# Patient Record
Sex: Female | Born: 1970
Health system: Southern US, Community
[De-identification: ages and names within clinical notes are randomized; demographics above are authoritative.]

## PROBLEM LIST (undated history)

## (undated) DIAGNOSIS — F411 Generalized anxiety disorder: Secondary | ICD-10-CM

## (undated) DIAGNOSIS — D649 Anemia, unspecified: Secondary | ICD-10-CM

## (undated) HISTORY — PX: TUBAL LIGATION: SHX77

## (undated) HISTORY — DX: Generalized anxiety disorder: F41.1

## (undated) HISTORY — DX: Anemia, unspecified: D64.9

---

## 2010-07-03 LAB — HM COLONOSCOPY

## 2013-12-19 ENCOUNTER — Encounter (HOSPITAL_BASED_OUTPATIENT_CLINIC_OR_DEPARTMENT_OTHER): Payer: Self-pay | Admitting: *Deleted

## 2013-12-19 ENCOUNTER — Emergency Department (HOSPITAL_BASED_OUTPATIENT_CLINIC_OR_DEPARTMENT_OTHER)
Admission: EM | Admit: 2013-12-19 | Discharge: 2013-12-19 | Disposition: A | Discharge status: Home / Self Care | Payer: Federal, State, Local not specified - PPO | Attending: Emergency Medicine | Admitting: Emergency Medicine

## 2013-12-19 DIAGNOSIS — S81812A Laceration without foreign body, left lower leg, initial encounter: Secondary | ICD-10-CM

## 2013-12-19 DIAGNOSIS — Z23 Encounter for immunization: Secondary | ICD-10-CM | POA: Insufficient documentation

## 2013-12-19 DIAGNOSIS — Y9389 Activity, other specified: Secondary | ICD-10-CM | POA: Insufficient documentation

## 2013-12-19 DIAGNOSIS — Y929 Unspecified place or not applicable: Secondary | ICD-10-CM | POA: Diagnosis not present

## 2013-12-19 DIAGNOSIS — S81811A Laceration without foreign body, right lower leg, initial encounter: Secondary | ICD-10-CM | POA: Insufficient documentation

## 2013-12-19 DIAGNOSIS — W228XXA Striking against or struck by other objects, initial encounter: Secondary | ICD-10-CM | POA: Insufficient documentation

## 2013-12-19 DIAGNOSIS — Y998 Other external cause status: Secondary | ICD-10-CM | POA: Insufficient documentation

## 2013-12-19 MED ORDER — TETANUS-DIPHTH-ACELL PERTUSSIS 5-2.5-18.5 LF-MCG/0.5 IM SUSP
0.5000 mL | Freq: Once | INTRAMUSCULAR | Status: AC
  Administered 2013-12-19: 0.5 mL via INTRAMUSCULAR
  Filled 2013-12-19: qty 0.5

## 2013-12-19 MED ORDER — HYDROCODONE-ACETAMINOPHEN 5-325 MG PO TABS: ORAL_TABLET | ORAL | Status: DC

## 2013-12-19 MED ORDER — LIDOCAINE-EPINEPHRINE 2 %-1:100000 IJ SOLN
20.0000 mL | Freq: Once | INTRAMUSCULAR | Status: AC
  Administered 2013-12-19: 20 mL

## 2013-12-19 MED ORDER — LIDOCAINE-EPINEPHRINE (PF) 2 %-1:200000 IJ SOLN
INTRAMUSCULAR | Status: AC
  Filled 2013-12-19: qty 20

## 2013-12-19 NOTE — ED Notes (Signed)
She was at the gym doing box jumps and both lower legs hit the edge of the wooden boxes. Lacerations to both lower legs. Bleeding controlled with pressure dressings.

## 2013-12-19 NOTE — ED Provider Notes (Signed)
CSN: [id number]     Arrival date & time 12/19/13  1938 History   First MD Initiated Contact with Patient 12/19/13 2044     Chief Complaint  Patient presents with  . Laceration     (Consider location/radiation/quality/duration/timing/severity/associated sxs/prior Treatment) HPI Comments: Patient presents with complaint of bilateral lower extremity lacerations after doing box jumps at the gymnasium. Patient slipped and struck lower legs on the edge of the wooden box. Patient was attended to on scene by an EMT bystander. Pressure was applied to the wounds and they were bandaged. Patient was transported to the emergency department by her husband. No other treatments prior to arrival. No difficulty walking.   The history is provided by the patient.    History reviewed. No pertinent past medical history. History reviewed. No pertinent past surgical history. No family history on file. History  Substance Use Topics  . Smoking status: Never Smoker   . Smokeless tobacco: Not on file  . Alcohol Use: No   OB History    No data available     Review of Systems  Constitutional: Negative for fever.  HENT: Negative for sore throat.   Eyes: Negative for discharge.  Gastrointestinal: Negative for rectal pain.  Genitourinary: Negative for dysuria, frequency, vaginal bleeding, vaginal discharge, genital sores and pelvic pain.  Musculoskeletal: Negative for arthralgias.  Skin: Positive for wound. Negative for rash.  Hematological: Negative for adenopathy.      Allergies  Review of patient's allergies indicates no known allergies.  Home Medications   Prior to Admission medications   Not on File   BP 93/61 mmHg  Pulse 66  Temp(Src) 98.3 F (36.8 C) (Oral)  Resp 16  Ht 5\' 1"  (1.549 m)  Wt 160 lb (72.576 kg)  BMI 30.25 kg/m2  SpO2 100% Physical Exam  Constitutional: She appears well-developed and well-nourished.  HENT:  Head: Normocephalic and atraumatic.  Eyes: Pupils are equal,  round, and reactive to light.  Neck: Normal range of motion. Neck supple.  Cardiovascular: Exam reveals no decreased pulses.   Pulses:      Dorsalis pedis pulses are 2+ on the right side, and 2+ on the left side.       Posterior tibial pulses are 2+ on the right side, and 2+ on the left side.  Musculoskeletal: She exhibits edema and tenderness.       Right ankle: Normal. She exhibits normal range of motion.       Left ankle: Normal. She exhibits normal range of motion.       Right lower leg: She exhibits laceration.       Left lower leg: She exhibits laceration.       Legs:      Right foot: Normal. There is normal range of motion.       Left foot: Normal. There is normal range of motion.  Neurological: She is alert. No sensory deficit.  Motor, sensation, and vascular distal to the injury is fully intact.   Skin: Skin is warm and dry.  Psychiatric: She has a normal mood and affect.  Nursing note and vitals reviewed.   ED Course  Procedures (including critical care time) Labs Review Labs Reviewed - No data to display  Imaging Review No results found.   EKG Interpretation None       Pt seen and examined. Discussed wound repair procedure. Patient agrees to proceed. No allergies.      LACERATION REPAIR Performed by: Carolee RotaGEIPLE,Vernal Hritz S Authorized by: Carolee RotaGEIPLE,Latria Mccarron S  Consent: Verbal consent obtained. Risks and benefits: risks, benefits and alternatives were discussed Consent given by: patient Patient identity confirmed: provided demographic data Prepped and Draped in normal sterile fashion Wound explored  Laceration Location: R anterior lower leg  Laceration Length: 6cm  No Foreign Bodies seen or palpated  Anesthesia: local infiltration  Local anesthetic: lidocaine 2% with epinephrine  Anesthetic total: 8 ml  Irrigation method: skin scrub with dermal cleanser Amount of cleaning: standard  Skin closure: 4-0 Ethilon  Number of sutures: 12  Technique: simple  interrupted  Patient tolerance: Patient tolerated the procedure well with no immediate complications.   LACERATION REPAIR Performed by: Carolee RotaGEIPLE,Toure Edmonds S Authorized by: Carolee RotaGEIPLE,Lorely Bubb S Consent: Verbal consent obtained. Risks and benefits: risks, benefits and alternatives were discussed Consent given by: patient Patient identity confirmed: provided demographic data Prepped and Draped in normal sterile fashion Wound explored  Laceration Location: L anterior lower leg  Laceration Length: 5cm  No Foreign Bodies seen or palpated  Anesthesia: local infiltration  Local anesthetic: lidocaine 2% with epinephrine  Anesthetic total: 8 ml  Irrigation method: skin scrub with dermal cleanser Amount of cleaning: standard  Skin closure: 4-0 Ethilon  Number of sutures: 9  Technique: simple interrupted  Patient tolerance: Patient tolerated the procedure well with no immediate complications.  Patient counseled on wound care. Patient counseled on need to return or see PCP/urgent care for suture removal in 10-14 days. Patient was urged to return to the Emergency Department urgently with worsening pain, swelling, expanding erythema especially if it streaks away from the affected area, fever, or if they have any other concerns. Patient verbalized understanding.   Patient counseled on use of narcotic pain medications. Counseled not to combine these medications with others containing tylenol. Urged not to drink alcohol, drive, or perform any other activities that requires focus while taking these medications. The patient verbalizes understanding and agrees with the plan.   MDM   Final diagnoses:  Laceration of right lower extremity, initial encounter  Laceration of left lower extremity, initial encounter   Patient with bilateral lower extremity lacerations. Wounds were clean without evidence of foreign body. There are no motor or neuro deficits distal to the injury. Wounds were cleaned and  repaired without complication.    Renne CriglerJoshua Laquan Ludden, PA-C 12/19/13 2324  Layla MawKristen N Ward, DO 12/19/13 2353

## 2013-12-19 NOTE — Discharge Instructions (Signed)
Please read and follow all provided instructions.  Your diagnoses today include:  1. Laceration of right lower extremity, initial encounter   2. Laceration of left lower extremity, initial encounter     Tests performed today include:  Vital signs. See below for your results today.   Medications prescribed:   Vicodin (hydrocodone/acetaminophen) - narcotic pain medication  DO NOT drive or perform any activities that require you to be awake and alert because this medicine can make you drowsy. BE VERY CAREFUL not to take multiple medicines containing Tylenol (also called acetaminophen). Doing so can lead to an overdose which can damage your liver and cause liver failure and possibly death.  Take any prescribed medications only as directed.   Home care instructions:  Follow any educational materials and wound care instructions contained in this packet.   Keep affected area above the level of your heart when possible to minimize swelling. Wash area gently twice a day with warm soapy water. Do not apply alcohol or hydrogen peroxide. Cover the area if it draining or weeping.   Follow-up instructions: Suture Removal: Return to the Emergency Department or see your primary care care doctor in 10-14 days for a recheck of your wound and removal of your sutures or staples.    Return instructions:  Return to the Emergency Department if you have:  Fever  Worsening pain  Worsening swelling of the wound  Pus draining from the wound  Redness of the skin that moves away from the wound, especially if it streaks away from the affected area   Any other emergent concerns  Your vital signs today were: BP 93/61 mmHg   Pulse 66   Temp(Src) 98.3 F (36.8 C) (Oral)   Resp 16   Ht 5\' 1"  (1.549 m)   Wt 160 lb (72.576 kg)   BMI 30.25 kg/m2   SpO2 100% If your blood pressure (BP) was elevated above 135/85 this visit, please have this repeated by your doctor within one month. --------------

## 2013-12-22 ENCOUNTER — Ambulatory Visit: Payer: Federal, State, Local not specified - PPO | Admitting: Medical

## 2013-12-30 ENCOUNTER — Ambulatory Visit (INDEPENDENT_AMBULATORY_CARE_PROVIDER_SITE_OTHER): Payer: Federal, State, Local not specified - PPO | Admitting: Medical

## 2013-12-30 ENCOUNTER — Encounter: Payer: Self-pay | Admitting: Medical

## 2013-12-30 VITALS — BP 116/76 | HR 70 | Temp 98.7°F | Ht 61.75 in | Wt 160.9 lb

## 2013-12-30 DIAGNOSIS — L0390 Cellulitis, unspecified: Secondary | ICD-10-CM | POA: Insufficient documentation

## 2013-12-30 DIAGNOSIS — Z4802 Encounter for removal of sutures: Secondary | ICD-10-CM | POA: Insufficient documentation

## 2013-12-30 DIAGNOSIS — L03119 Cellulitis of unspecified part of limb: Secondary | ICD-10-CM

## 2013-12-30 MED ORDER — CEPHALEXIN 500 MG PO CAPS: 500.0000 mg | ORAL_CAPSULE | Freq: Three times a day (TID) | ORAL | Status: DC

## 2013-12-30 NOTE — Patient Instructions (Signed)
Your lacerations don't look completley healed and secondary infection may be be impeding healing process. So I am prescribing cephalexin antibiotic but do want you to come in on Monday or Tuesday for removal of remaining sutures. At that point will be 14-15 days.

## 2013-12-30 NOTE — Progress Notes (Signed)
Pre visit review using our clinic review tool, if applicable. No additional management support is needed unless otherwise documented below in the visit note. 

## 2013-12-30 NOTE — Progress Notes (Signed)
Subjective:    Patient ID: Kristin Macdonald, female    DOB: 1971/02/05, 43 y.o.   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  Pt has hx of anemia during pregnancy. But none since. Last labs one yr ago and hb/hct normal.  4 aunts on dad side had breast cancer. Pt states every mammogram. She states some calcium build up that caused her to have work up but then determined negative.   [facility] Pt does see gynecologist for her paps.  In December she is due for routine screening. She does have apponitment.  Pt was doing some box jumps. She had sutures placed 11 days. Ago.Pt states very deep. She had sutures place in our ED. No antibiotics given after surgery procedure.  Past Medical History  Diagnosis Date  . Anemia     History   Social History  . Marital Status: Married    Spouse Name: N/A    Number of Children: N/A  . Years of Education: N/A   Occupational History  . Not on file.   Social History Main Topics  . Smoking status: Never Smoker   . Smokeless tobacco: Not on file  . Alcohol Use: No  . Drug Use: No  . Sexual Activity: Yes   Other Topics Concern  . Not on file   Social History Narrative    Past Surgical History  Procedure Laterality Date  . Tubal ligation    . Tubal ligation      Family History  Problem Relation Age of Onset  . Hypertension Mother   . Cancer Father     No Known Allergies  Current Outpatient Prescriptions on File Prior to Visit  Medication Sig Dispense Refill  . HYDROcodone-acetaminophen (NORCO/VICODIN) 5-325 MG per tablet Take 1-2 tablets every 6 hours as needed for severe pain 10 tablet 0   No current facility-administered medications on file prior to visit.    BP 116/76 mmHg  Pulse 70  Temp(Src) 98.7 F (37.1 C) (Oral)  Ht 5' 1.75"  (1.568 m)  Wt 160 lb 14.4 oz (72.984 kg)  BMI 29.68 kg/m2  SpO2 100%  LMP 12/09/2013           Review of Systems  Constitutional: Negative for fever, chills and fatigue.  HENT: Negative for congestion, ear discharge, ear pain, nosebleeds, postnasal drip, rhinorrhea, sinus pressure, sore throat and trouble swallowing.   Respiratory: Negative for cough, chest tightness, shortness of breath and wheezing.   Cardiovascular: Negative for chest pain and palpitations.  Gastrointestinal: Negative for nausea, vomiting, abdominal pain, diarrhea and constipation.  Genitourinary: Negative for dysuria and flank pain.  Musculoskeletal: Negative for back pain.  Skin:       Healing lacerations to both pretibial areas. Redness to suture area.  Neurological: Negative for dizziness, tremors, seizures, syncope, weakness, light-headedness, numbness and headaches.  Hematological: Negative for adenopathy. Does not bruise/bleed easily.  Psychiatric/Behavioral: Negative for suicidal ideas, behavioral problems and dysphoric mood. The patient is not nervous/anxious.        Objective:   Physical Exam   Lower- ext- v shaped lacerations. Rt side 6cm length. Lt side 5 cm. Both have redness and tenderness around sutures. Some areas don't look completely healed. Other areas sutures area partially buried.  Assessment & Plan:

## 2013-12-30 NOTE — Assessment & Plan Note (Signed)
Secondary infection of wound. Post trauma. Rx cephalexin and follow up on Monday for suture removal.

## 2013-12-30 NOTE — Assessment & Plan Note (Signed)
Slow healing wound and infection may be slowing down healing process. I removed all sutures on rt and left leg that were buried and looked like would be difficult to remove on follow up in 3 days. But left 8 in rt pretibial area and 4 in left pretibial area since wound did not look healed and wanted to avoid dehiscence. Follow up on Monday for wound eval and likely removal of all sutures.

## 2014-01-02 ENCOUNTER — Ambulatory Visit (INDEPENDENT_AMBULATORY_CARE_PROVIDER_SITE_OTHER): Payer: Federal, State, Local not specified - PPO | Admitting: Medical

## 2014-01-02 ENCOUNTER — Encounter: Payer: Self-pay | Admitting: Medical

## 2014-01-02 ENCOUNTER — Telehealth: Payer: Self-pay | Admitting: Medical

## 2014-01-02 VITALS — BP 106/72 | HR 77 | Temp 99.0°F | Ht 61.75 in | Wt 160.8 lb

## 2014-01-02 DIAGNOSIS — L03119 Cellulitis of unspecified part of limb: Secondary | ICD-10-CM

## 2014-01-02 DIAGNOSIS — S81809D Unspecified open wound, unspecified lower leg, subsequent encounter: Secondary | ICD-10-CM

## 2014-01-02 NOTE — Telephone Encounter (Signed)
Caller name: Zaraya Relation to pt: self Call back number: [phone] Pharmacy: [facility] at [address]  Reason for call:   Patient states that Ramon Dredgedward was going to send in another antibiotic because he said that the infection has not cleared. I don't see that anything has been sent.

## 2014-01-02 NOTE — Progress Notes (Signed)
Pre visit review using our clinic review tool, if applicable. No additional management support is needed unless otherwise documented below in the visit note. 

## 2014-01-02 NOTE — Patient Instructions (Signed)
Your laceration does look improved but not as I would expect. I removed all sutures today. I am switching you to bactrim ds. Stop cephalexin. We are sending out wound culture.  Follow up on Monday(7 days)with me or as needed.  If you legs do not look improved then will refer you to wound care for slow healing wound.

## 2014-01-02 NOTE — Telephone Encounter (Signed)
Patient is requesting a call back when this has been sent

## 2014-01-02 NOTE — Progress Notes (Signed)
   Subjective:    Patient ID: Kristin Macdonald, female    DOB: Mar 25, 1970, 43 y.o.   MRN: [id number]  HPI  Pt in for follow for  her pretibial laceration wounds. I removed all of her sutures today since the wounds looked like may have been infected and likely were delayed  healing on last visit. No fevers, no chills. No obvious discharge. 14 days today since sutures placed so I removed all remaining sutures.  Pt has been on cephalexin. Pt reports the red areas are better in term of soreness but she still has some tenderness.  Past Medical History  Diagnosis Date  . Anemia     History   Social History  . Marital Status: Married    Spouse Name: N/A    Number of Children: N/A  . Years of Education: N/A   Occupational History  . Not on file.   Social History Main Topics  . Smoking status: Never Smoker   . Smokeless tobacco: Not on file  . Alcohol Use: No  . Drug Use: No  . Sexual Activity: Yes   Other Topics Concern  . Not on file   Social History Narrative    Past Surgical History  Procedure Laterality Date  . Tubal ligation    . Tubal ligation      Family History  Problem Relation Age of Onset  . Hypertension Mother   . Cancer Father     No Known Allergies  Current Outpatient Prescriptions on File Prior to Visit  Medication Sig Dispense Refill  . cephALEXin (KEFLEX) 500 MG capsule Take 1 capsule (500 mg total) by mouth 3 (three) times daily. 21 capsule 0  . HYDROcodone-acetaminophen (NORCO/VICODIN) 5-325 MG per tablet Take 1-2 tablets every 6 hours as needed for severe pain 10 tablet 0   No current facility-administered medications on file prior to visit.    BP 106/72 mmHg  Pulse 77  Temp(Src) 99 F (37.2 C) (Oral)  Ht 5' 1.75" (1.568 m)  Wt 160 lb 12.8 oz (72.938 kg)  BMI 29.67 kg/m2  SpO2 98%  LMP 12/09/2013       Review of Systems  Constitutional: Negative for fever, chills and fatigue.  Respiratory: Negative for cough, chest tightness,  shortness of breath and wheezing.   Cardiovascular: Negative for chest pain and palpitations.  Gastrointestinal: Negative for nausea, vomiting, abdominal pain, diarrhea and constipation.  Genitourinary: Negative for dysuria and flank pain.  Musculoskeletal: Negative for back pain.  Skin:       Around the laceration still pinkish red appearance.  Neurological: Negative for dizziness and numbness.  Hematological: Negative for adenopathy. Does not bruise/bleed easily.       Objective:   Physical Exam   Lower extremities Still red appearance. Rt side of tibia laceration  area is improved but not completely. Mid center of laceration dry yellow crusted scab.  Lt side tibia laceration area still shows some region where edges of laceration are not completely healed.  Did remove all sutures since at this point needs to be removed since only serving as source of irritation and body beginning to grow over sutures in most areas of laceration.         Assessment & Plan:

## 2014-01-03 ENCOUNTER — Other Ambulatory Visit: Payer: Self-pay

## 2014-01-03 MED ORDER — SULFAMETHOXAZOLE-TRIMETHOPRIM 800-160 MG PO TABS: 1.0000 | ORAL_TABLET | Freq: Two times a day (BID) | ORAL | Status: DC

## 2014-01-03 NOTE — Assessment & Plan Note (Signed)
Today, I got a wound culture since some areas of the wound are not healing as fast as expected. Stop cephalexin. Rx bactrim. Follow up in one week or as needed. If wounds not healing completely by follow up then send to wound care center.

## 2014-01-03 NOTE — Telephone Encounter (Signed)
Patient seen in office yesterday.

## 2014-01-05 LAB — WOUND CULTURE
GRAM STAIN: NONE SEEN
Gram Stain: NONE SEEN

## 2014-01-09 ENCOUNTER — Encounter: Payer: Self-pay | Admitting: Medical

## 2014-01-09 ENCOUNTER — Ambulatory Visit (INDEPENDENT_AMBULATORY_CARE_PROVIDER_SITE_OTHER): Payer: Federal, State, Local not specified - PPO | Admitting: Medical

## 2014-01-09 VITALS — BP 117/84 | HR 87 | Temp 98.3°F | Ht 61.75 in | Wt 155.8 lb

## 2014-01-09 DIAGNOSIS — L03119 Cellulitis of unspecified part of limb: Secondary | ICD-10-CM

## 2014-01-09 DIAGNOSIS — F411 Generalized anxiety disorder: Secondary | ICD-10-CM

## 2014-01-09 HISTORY — DX: Generalized anxiety disorder: F41.1

## 2014-01-09 MED ORDER — CLONAZEPAM 0.5 MG PO TABS: 0.5000 mg | ORAL_TABLET | Freq: Two times a day (BID) | ORAL | Status: DC | PRN

## 2014-01-09 NOTE — Assessment & Plan Note (Signed)
Your pretibial regions are finally healing. They don't look to have any infection today. There was some bacteria on culture but not many. Bactrim was on the sensitivity report and clinically it  appears healing now. So  I don't think you need further antibiotics except just to finish full course of what your are already on.

## 2014-01-09 NOTE — Patient Instructions (Addendum)
Your pretibial regions are finally healing. They don't look to have any infection today. There was some bacteria on culture but not many. Bactrim was on the sensitivity report and clinically it  appears healing now. So  I don't think you need further antibiotics except just to finish full course of what your are already on.  For your recently anxiety related to your marital situation, I am making clonazepam to use if needed.  Follow up 2-4 weeks if needed for either of above conditions

## 2014-01-09 NOTE — Progress Notes (Signed)
   Subjective:    Patient ID: Kristin Macdonald, female    DOB: [birth year], 43 y.o.   MRN: [id number]  HPI   Pt in for check of her pretibial laceration wounds. Pt states that the area are no longer tender. Full scabs are forming. No discharge. No fever or chills.   Pt states that she is very stressed and anxious. Pt husband just told her day after thanksgiving that he wanted a divorce. She is scheduled to see a therapist. No history of anxiety and depression.   Past Medical History  Diagnosis Date  . Anemia     History   Social History  . Marital Status: Married    Spouse Name: N/A    Number of Children: N/A  . Years of Education: N/A   Occupational History  . Not on file.   Social History Main Topics  . Smoking status: Never Smoker   . Smokeless tobacco: Not on file  . Alcohol Use: No  . Drug Use: No  . Sexual Activity: Yes   Other Topics Concern  . Not on file   Social History Narrative    Past Surgical History  Procedure Laterality Date  . Tubal ligation    . Tubal ligation      Family History  Problem Relation Age of Onset  . Hypertension Mother   . Cancer Father     No Known Allergies  Current Outpatient Prescriptions on File Prior to Visit  Medication Sig Dispense Refill  . cephALEXin (KEFLEX) 500 MG capsule Take 1 capsule (500 mg total) by mouth 3 (three) times daily. 21 capsule 0  . HYDROcodone-acetaminophen (NORCO/VICODIN) 5-325 MG per tablet Take 1-2 tablets every 6 hours as needed for severe pain 10 tablet 0   No current facility-administered medications on file prior to visit.    BP 117/84 mmHg  Pulse 87  Temp(Src) 98.3 F (36.8 C) (Oral)  Ht 5' 1.75" (1.568 m)  Wt 155 lb 12.8 oz (70.67 kg)  BMI 28.74 kg/m2  SpO2 98%  LMP 12/09/2013      Review of Systems  Constitutional: Negative for fever, chills and fatigue.  Respiratory: Negative for cough, chest tightness, shortness of breath and wheezing.   Cardiovascular: Negative for  chest pain and palpitations.  Gastrointestinal: Negative for nausea, vomiting, abdominal pain, diarrhea and constipation.  Genitourinary: Negative for dysuria and flank pain.  Musculoskeletal: Negative for back pain.  Skin:       Around the laceration no longer pink or red. Scabs formed and no drainage.  Neurological: Negative for dizziness and numbness.  Hematological: Negative for adenopathy. Does not bruise/bleed easily.       Came on recently with husband request for divorce  Psychiatric/Behavioral: Negative for suicidal ideas, behavioral problems, confusion, sleep disturbance, self-injury, dysphoric mood, decreased concentration and agitation. The patient is nervous/anxious.        Objective:   Physical Exam   General- no acute distress. But mild sad and anxious appearance. Lungs- CTA Heart-RRR Skin- pretibial Lacerations have dry appearance now. The prior gap on left side has closed up. No redness now. No warmth and areas are not tender.  Neuro- CN III-XII grossly intact.       Assessment & Plan:

## 2014-01-09 NOTE — Assessment & Plan Note (Signed)
For your recently anxiety related to your marital situation, I am making clonazepam to use if needed. If this becomes a chronic issue then would recommend rx of sertraline and try minimize the use of the clonazapam.

## 2014-01-09 NOTE — Progress Notes (Signed)
Pre visit review using our clinic review tool, if applicable. No additional management support is needed unless otherwise documented below in the visit note. 

## 2015-05-21 ENCOUNTER — Encounter: Payer: Self-pay | Admitting: Medical

## 2015-05-21 ENCOUNTER — Ambulatory Visit (INDEPENDENT_AMBULATORY_CARE_PROVIDER_SITE_OTHER): Payer: Federal, State, Local not specified - PPO | Admitting: Medical

## 2015-05-21 ENCOUNTER — Telehealth: Payer: Self-pay | Admitting: Medical

## 2015-05-21 VITALS — BP 114/76 | HR 71 | Temp 98.1°F | Ht 62.0 in | Wt 153.4 lb

## 2015-05-21 DIAGNOSIS — Z0000 Encounter for general adult medical examination without abnormal findings: Secondary | ICD-10-CM | POA: Diagnosis not present

## 2015-05-21 DIAGNOSIS — Z803 Family history of malignant neoplasm of breast: Secondary | ICD-10-CM

## 2015-05-21 DIAGNOSIS — R7989 Other specified abnormal findings of blood chemistry: Secondary | ICD-10-CM

## 2015-05-21 DIAGNOSIS — Z1239 Encounter for other screening for malignant neoplasm of breast: Secondary | ICD-10-CM

## 2015-05-21 DIAGNOSIS — Z124 Encounter for screening for malignant neoplasm of cervix: Secondary | ICD-10-CM | POA: Diagnosis not present

## 2015-05-21 DIAGNOSIS — R922 Inconclusive mammogram: Secondary | ICD-10-CM

## 2015-05-21 HISTORY — DX: Encounter for general adult medical examination without abnormal findings: Z00.00

## 2015-05-21 MED ORDER — AMMONIUM LACTATE 12 % EX CREA: TOPICAL_CREAM | CUTANEOUS | Status: DC | PRN

## 2015-05-21 MED FILL — AMMONIUM LACTATE 12% CREAM: 12 | 30 days supply | Qty: 385 | Fill #0

## 2015-05-21 NOTE — Telephone Encounter (Signed)
Pt wants female gyn. Notified referal staff.

## 2015-05-21 NOTE — Addendum Note (Signed)
Addended by: Gwenevere AbbotSAGUIER, Filicia Scogin M on: 05/21/2015 09:48 PM   Modules accepted: Kipp BroodSmartSet

## 2015-05-21 NOTE — Patient Instructions (Addendum)
Wellness examination Future fasting labs to be done. Including urine dip.   Continue diet and exercise.  Get vitamin d when in for labs.  Get [place] papsmear report when you get that done.  Will schedule 3-d mammogram for this coming May. Hx of dense tissue. Hx of aunts on fathers side with breast cancer.  For your dry skin rx lac-hydrin.  Watch area on your back. May be early vitiligo. If area worsens can refer to dermatologist.  Follow up to be determined after lab review.  Preventive Care for Adults, Female A healthy lifestyle and preventive care can promote health and wellness. Preventive health guidelines for women include the following key practices.  A routine yearly physical is a good way to check with your health care provider about your health and preventive screening. It is a chance to share any concerns and updates on your health and to receive a thorough exam.  Visit your dentist for a routine exam and preventive care every 6 months. Brush your teeth twice a day and floss once a day. Good oral hygiene prevents tooth decay and gum disease.  The frequency of eye exams is based on your age, health, family medical history, use of contact lenses, and other factors. Follow your health care provider's recommendations for frequency of eye exams.  Eat a healthy diet. Foods like vegetables, fruits, whole grains, low-fat dairy products, and lean protein foods contain the nutrients you need without too many calories. Decrease your intake of foods high in solid fats, added sugars, and salt. Eat the right amount of calories for you.Get information about a proper diet from your health care provider, if necessary.  Regular physical exercise is one of the most important things you can do for your health. Most adults should get at least 150 minutes of moderate-intensity exercise (any activity that increases your heart rate and causes you to sweat) each week. In addition, most adults need  muscle-strengthening exercises on 2 or more days a week.  Maintain a healthy weight. The body mass index (BMI) is a screening tool to identify possible weight problems. It provides an estimate of body fat based on height and weight. Your health care provider can find your BMI and can help you achieve or maintain a healthy weight.For adults 20 years and older:  A BMI below 18.5 is considered underweight.  A BMI of 18.5 to 24.9 is normal.  A BMI of 25 to 29.9 is considered overweight.  A BMI of 30 and above is considered obese.  Maintain normal blood lipids and cholesterol levels by exercising and minimizing your intake of saturated fat. Eat a balanced diet with plenty of fruit and vegetables. Blood tests for lipids and cholesterol should begin at age 62 and be repeated every 5 years. If your lipid or cholesterol levels are high, you are over 50, or you are at high risk for heart disease, you may need your cholesterol levels checked more frequently.Ongoing high lipid and cholesterol levels should be treated with medicines if diet and exercise are not working.  If you smoke, find out from your health care provider how to quit. If you do not use tobacco, do not start.  Lung cancer screening is recommended for adults aged 42-80 years who are at high risk for developing lung cancer because of a history of smoking. A yearly low-dose CT scan of the lungs is recommended for people who have at least a 30-pack-year history of smoking and are a current smoker or  have quit within the past 15 years. A pack year of smoking is smoking an average of 1 pack of cigarettes a day for 1 year (for example: 1 pack a day for 30 years or 2 packs a day for 15 years). Yearly screening should continue until the smoker has stopped smoking for at least 15 years. Yearly screening should be stopped for people who develop a health problem that would prevent them from having lung cancer treatment.  If you are pregnant, do not  drink alcohol. If you are breastfeeding, be very cautious about drinking alcohol. If you are not pregnant and choose to drink alcohol, do not have more than 1 drink per day. One drink is considered to be 12 ounces (355 mL) of beer, 5 ounces (148 mL) of wine, or 1.5 ounces (44 mL) of liquor.  Avoid use of street drugs. Do not share needles with anyone. Ask for help if you need support or instructions about stopping the use of drugs.  High blood pressure causes heart disease and increases the risk of stroke. Your blood pressure should be checked at least every 1 to 2 years. Ongoing high blood pressure should be treated with medicines if weight loss and exercise do not work.  If you are 9-38 years old, ask your health care provider if you should take aspirin to prevent strokes.  Diabetes screening is done by taking a blood sample to check your blood glucose level after you have not eaten for a certain period of time (fasting). If you are not overweight and you do not have risk factors for diabetes, you should be screened once every 3 years starting at age 8. If you are overweight or obese and you are 11-74 years of age, you should be screened for diabetes every year as part of your cardiovascular risk assessment.  Breast cancer screening is essential preventive care for women. You should practice "breast self-awareness." This means understanding the normal appearance and feel of your breasts and may include breast self-examination. Any changes detected, no matter how small, should be reported to a health care provider. Women in their 75s and 30s should have a clinical breast exam (CBE) by a health care provider as part of a regular health exam every 1 to 3 years. After age 55, women should have a CBE every year. Starting at age 62, women should consider having a mammogram (breast X-ray test) every year. Women who have a family history of breast cancer should talk to their health care provider about genetic  screening. Women at a high risk of breast cancer should talk to their health care providers about having an MRI and a mammogram every year.  Breast cancer gene (BRCA)-related cancer risk assessment is recommended for women who have family members with BRCA-related cancers. BRCA-related cancers include breast, ovarian, tubal, and peritoneal cancers. Having family members with these cancers may be associated with an increased risk for harmful changes (mutations) in the breast cancer genes BRCA1 and BRCA2. Results of the assessment will determine the need for genetic counseling and BRCA1 and BRCA2 testing.  Your health care provider may recommend that you be screened regularly for cancer of the pelvic organs (ovaries, uterus, and vagina). This screening involves a pelvic examination, including checking for microscopic changes to the surface of your cervix (Pap test). You may be encouraged to have this screening done every 3 years, beginning at age 47.  For women ages 63-65, health care providers may recommend pelvic exams and Pap  testing every 3 years, or they may recommend the Pap and pelvic exam, combined with testing for human papilloma virus (HPV), every 5 years. Some types of HPV increase your risk of cervical cancer. Testing for HPV may also be done on women of any age with unclear Pap test results.  Other health care providers may not recommend any screening for nonpregnant women who are considered low risk for pelvic cancer and who do not have symptoms. Ask your health care provider if a screening pelvic exam is right for you.  If you have had past treatment for cervical cancer or a condition that could lead to cancer, you need Pap tests and screening for cancer for at least 20 years after your treatment. If Pap tests have been discontinued, your risk factors (such as having a new sexual partner) need to be reassessed to determine if screening should resume. Some women have medical problems that  increase the chance of getting cervical cancer. In these cases, your health care provider may recommend more frequent screening and Pap tests.  Colorectal cancer can be detected and often prevented. Most routine colorectal cancer screening begins at the age of 34 years and continues through age 110 years. However, your health care provider may recommend screening at an earlier age if you have risk factors for colon cancer. On a yearly basis, your health care provider may provide home test kits to check for hidden blood in the stool. Use of a small camera at the end of a tube, to directly examine the colon (sigmoidoscopy or colonoscopy), can detect the earliest forms of colorectal cancer. Talk to your health care provider about this at age 61, when routine screening begins. Direct exam of the colon should be repeated every 5-10 years through age 34 years, unless early forms of precancerous polyps or small growths are found.  People who are at an increased risk for hepatitis B should be screened for this virus. You are considered at high risk for hepatitis B if:  You were born in a country where hepatitis B occurs often. Talk with your health care provider about which countries are considered high risk.  Your parents were born in a high-risk country and you have not received a shot to protect against hepatitis B (hepatitis B vaccine).  You have HIV or AIDS.  You use needles to inject street drugs.  You live with, or have sex with, someone who has hepatitis B.  You get hemodialysis treatment.  You take certain medicines for conditions like cancer, organ transplantation, and autoimmune conditions.  Hepatitis C blood testing is recommended for all people born from [birth year] through 1965 and any individual with known risks for hepatitis C.  Practice safe sex. Use condoms and avoid high-risk sexual practices to reduce the spread of sexually transmitted infections (STIs). STIs include gonorrhea, chlamydia,  syphilis, trichomonas, herpes, HPV, and human immunodeficiency virus (HIV). Herpes, HIV, and HPV are viral illnesses that have no cure. They can result in disability, cancer, and death.  You should be screened for sexually transmitted illnesses (STIs) including gonorrhea and chlamydia if:  You are sexually active and are younger than 24 years.  You are older than 24 years and your health care provider tells you that you are at risk for this type of infection.  Your sexual activity has changed since you were last screened and you are at an increased risk for chlamydia or gonorrhea. Ask your health care provider if you are at risk.  If  you are at risk of being infected with HIV, it is recommended that you take a prescription medicine daily to prevent HIV infection. This is called preexposure prophylaxis (PrEP). You are considered at risk if:  You are sexually active and do not regularly use condoms or know the HIV status of your partner(s).  You take drugs by injection.  You are sexually active with a partner who has HIV.  Talk with your health care provider about whether you are at high risk of being infected with HIV. If you choose to begin PrEP, you should first be tested for HIV. You should then be tested every 3 months for as long as you are taking PrEP.  Osteoporosis is a disease in which the bones lose minerals and strength with aging. This can result in serious bone fractures or breaks. The risk of osteoporosis can be identified using a bone density scan. Women ages 30 years and over and women at risk for fractures or osteoporosis should discuss screening with their health care providers. Ask your health care provider whether you should take a calcium supplement or vitamin D to reduce the rate of osteoporosis.  Menopause can be associated with physical symptoms and risks. Hormone replacement therapy is available to decrease symptoms and risks. You should talk to your health care provider  about whether hormone replacement therapy is right for you.  Use sunscreen. Apply sunscreen liberally and repeatedly throughout the day. You should seek shade when your shadow is shorter than you. Protect yourself by wearing long sleeves, pants, a wide-brimmed hat, and sunglasses year round, whenever you are outdoors.  Once a month, do a whole body skin exam, using a mirror to look at the skin on your back. Tell your health care provider of new moles, moles that have irregular borders, moles that are larger than a pencil eraser, or moles that have changed in shape or color.  Stay current with required vaccines (immunizations).  Influenza vaccine. All adults should be immunized every year.  Tetanus, diphtheria, and acellular pertussis (Td, Tdap) vaccine. Pregnant women should receive 1 dose of Tdap vaccine during each pregnancy. The dose should be obtained regardless of the length of time since the last dose. Immunization is preferred during the 27th-36th week of gestation. An adult who has not previously received Tdap or who does not know her vaccine status should receive 1 dose of Tdap. This initial dose should be followed by tetanus and diphtheria toxoids (Td) booster doses every 10 years. Adults with an unknown or incomplete history of completing a 3-dose immunization series with Td-containing vaccines should begin or complete a primary immunization series including a Tdap dose. Adults should receive a Td booster every 10 years.  Varicella vaccine. An adult without evidence of immunity to varicella should receive 2 doses or a second dose if she has previously received 1 dose. Pregnant females who do not have evidence of immunity should receive the first dose after pregnancy. This first dose should be obtained before leaving the health care facility. The second dose should be obtained 4-8 weeks after the first dose.  Human papillomavirus (HPV) vaccine. Females aged 13-26 years who have not received  the vaccine previously should obtain the 3-dose series. The vaccine is not recommended for use in pregnant females. However, pregnancy testing is not needed before receiving a dose. If a female is found to be pregnant after receiving a dose, no treatment is needed. In that case, the remaining doses should be delayed until after  the pregnancy. Immunization is recommended for any person with an immunocompromised condition through the age of 66 years if she did not get any or all doses earlier. During the 3-dose series, the second dose should be obtained 4-8 weeks after the first dose. The third dose should be obtained 24 weeks after the first dose and 16 weeks after the second dose.  Zoster vaccine. One dose is recommended for adults aged 26 years or older unless certain conditions are present.  Measles, mumps, and rubella (MMR) vaccine. Adults born before [birth year] generally are considered immune to measles and mumps. Adults born in [birth year] or later should have 1 or more doses of MMR vaccine unless there is a contraindication to the vaccine or there is laboratory evidence of immunity to each of the three diseases. A routine second dose of MMR vaccine should be obtained at least 28 days after the first dose for students attending postsecondary schools, health care workers, or international travelers. People who received inactivated measles vaccine or an unknown type of measles vaccine during 1963-1967 should receive 2 doses of MMR vaccine. People who received inactivated mumps vaccine or an unknown type of mumps vaccine before 1979 and are at high risk for mumps infection should consider immunization with 2 doses of MMR vaccine. For females of childbearing age, rubella immunity should be determined. If there is no evidence of immunity, females who are not pregnant should be vaccinated. If there is no evidence of immunity, females who are pregnant should delay immunization until after pregnancy. Unvaccinated health care  workers born before [birth year] who lack laboratory evidence of measles, mumps, or rubella immunity or laboratory confirmation of disease should consider measles and mumps immunization with 2 doses of MMR vaccine or rubella immunization with 1 dose of MMR vaccine.  Pneumococcal 13-valent conjugate (PCV13) vaccine. When indicated, a person who is uncertain of his immunization history and has no record of immunization should receive the PCV13 vaccine. All adults 11 years of age and older should receive this vaccine. An adult aged 83 years or older who has certain medical conditions and has not been previously immunized should receive 1 dose of PCV13 vaccine. This PCV13 should be followed with a dose of pneumococcal polysaccharide (PPSV23) vaccine. Adults who are at high risk for pneumococcal disease should obtain the PPSV23 vaccine at least 8 weeks after the dose of PCV13 vaccine. Adults older than 46 years of age who have normal immune system function should obtain the PPSV23 vaccine dose at least 1 year after the dose of PCV13 vaccine.  Pneumococcal polysaccharide (PPSV23) vaccine. When PCV13 is also indicated, PCV13 should be obtained first. All adults aged 46 years and older should be immunized. An adult younger than age 38 years who has certain medical conditions should be immunized. Any person who resides in a nursing home or long-term care facility should be immunized. An adult smoker should be immunized. People with an immunocompromised condition and certain other conditions should receive both PCV13 and PPSV23 vaccines. People with human immunodeficiency virus (HIV) infection should be immunized as soon as possible after diagnosis. Immunization during chemotherapy or radiation therapy should be avoided. Routine use of PPSV23 vaccine is not recommended for American Indians, [place] Natives, or people younger than 65 years unless there are medical conditions that require PPSV23 vaccine. When indicated, people who  have unknown immunization and have no record of immunization should receive PPSV23 vaccine. One-time revaccination 5 years after the first dose of PPSV23 is recommended for people aged  19-64 years who have chronic kidney failure, nephrotic syndrome, asplenia, or immunocompromised conditions. People who received 1-2 doses of PPSV23 before age 5 years should receive another dose of PPSV23 vaccine at age 72 years or later if at least 5 years have passed since the previous dose. Doses of PPSV23 are not needed for people immunized with PPSV23 at or after age 46 years.  Meningococcal vaccine. Adults with asplenia or persistent complement component deficiencies should receive 2 doses of quadrivalent meningococcal conjugate (MenACWY-D) vaccine. The doses should be obtained at least 2 months apart. Microbiologists working with certain meningococcal bacteria, [place] recruits, people at risk during an outbreak, and people who travel to or live in countries with a high rate of meningitis should be immunized. A first-year college student up through age 65 years who is living in a residence hall should receive a dose if she did not receive a dose on or after her 16th birthday. Adults who have certain high-risk conditions should receive one or more doses of vaccine.  Hepatitis A vaccine. Adults who wish to be protected from this disease, have certain high-risk conditions, work with hepatitis A-infected animals, work in hepatitis A research labs, or travel to or work in countries with a high rate of hepatitis A should be immunized. Adults who were previously unvaccinated and who anticipate close contact with an international adoptee during the first 60 days after arrival in the [place] from a country with a high rate of hepatitis A should be immunized.  Hepatitis B vaccine. Adults who wish to be protected from this disease, have certain high-risk conditions, may be exposed to blood or other infectious body fluids,  are household contacts or sex partners of hepatitis B positive people, are clients or workers in certain care facilities, or travel to or work in countries with a high rate of hepatitis B should be immunized.  Haemophilus influenzae type b (Hib) vaccine. A previously unvaccinated person with asplenia or sickle cell disease or having a scheduled splenectomy should receive 1 dose of Hib vaccine. Regardless of previous immunization, a recipient of a hematopoietic stem cell transplant should receive a 3-dose series 6-12 months after her successful transplant. Hib vaccine is not recommended for adults with HIV infection. Preventive Services / Frequency Ages 33 to 11 years  Blood pressure check.** / Every 3-5 years.  Lipid and cholesterol check.** / Every 5 years beginning at age 57.  Clinical breast exam.** / Every 3 years for women in their 67s and 2s.  BRCA-related cancer risk assessment.** / For women who have family members with a BRCA-related cancer (breast, ovarian, tubal, or peritoneal cancers).  Pap test.** / Every 2 years from ages 15 through 42. Every 3 years starting at age 67 through age 45 or 28 with a history of 3 consecutive normal Pap tests.  HPV screening.** / Every 3 years from ages 68 through ages 80 to 25 with a history of 3 consecutive normal Pap tests.  Hepatitis C blood test.** / For any individual with known risks for hepatitis C.  Skin self-exam. / Monthly.  Influenza vaccine. / Every year.  Tetanus, diphtheria, and acellular pertussis (Tdap, Td) vaccine.** / Consult your health care provider. Pregnant women should receive 1 dose of Tdap vaccine during each pregnancy. 1 dose of Td every 10 years.  Varicella vaccine.** / Consult your health care provider. Pregnant females who do not have evidence of immunity should receive the first dose after pregnancy.  HPV vaccine. / 3 doses over  6 months, if 48 and younger. The vaccine is not recommended for use in pregnant  females. However, pregnancy testing is not needed before receiving a dose.  Measles, mumps, rubella (MMR) vaccine.** / You need at least 1 dose of MMR if you were born in 1957 or later. You may also need a 2nd dose. For females of childbearing age, rubella immunity should be determined. If there is no evidence of immunity, females who are not pregnant should be vaccinated. If there is no evidence of immunity, females who are pregnant should delay immunization until after pregnancy.  Pneumococcal 13-valent conjugate (PCV13) vaccine.** / Consult your health care provider.  Pneumococcal polysaccharide (PPSV23) vaccine.** / 1 to 2 doses if you smoke cigarettes or if you have certain conditions.  Meningococcal vaccine.** / 1 dose if you are age 52 to 42 years and a Orthoptist living in a residence hall, or have one of several medical conditions, you need to get vaccinated against meningococcal disease. You may also need additional booster doses.  Hepatitis A vaccine.** / Consult your health care provider.  Hepatitis B vaccine.** / Consult your health care provider.  Haemophilus influenzae type b (Hib) vaccine.** / Consult your health care provider. Ages 3 to 64 years  Blood pressure check.** / Every year.  Lipid and cholesterol check.** / Every 5 years beginning at age 49 years.  Lung cancer screening. / Every year if you are aged 55-80 years and have a 30-pack-year history of smoking and currently smoke or have quit within the past 15 years. Yearly screening is stopped once you have quit smoking for at least 15 years or develop a health problem that would prevent you from having lung cancer treatment.  Clinical breast exam.** / Every year after age 37 years.  BRCA-related cancer risk assessment.** / For women who have family members with a BRCA-related cancer (breast, ovarian, tubal, or peritoneal cancers).  Mammogram.** / Every year beginning at age 29 years and continuing  for as long as you are in good health. Consult with your health care provider.  Pap test.** / Every 3 years starting at age 68 years through age 39 or 47 years with a history of 3 consecutive normal Pap tests.  HPV screening.** / Every 3 years from ages 36 years through ages 42 to 66 years with a history of 3 consecutive normal Pap tests.  Fecal occult blood test (FOBT) of stool. / Every year beginning at age 50 years and continuing until age 38 years. You may not need to do this test if you get a colonoscopy every 10 years.  Flexible sigmoidoscopy or colonoscopy.** / Every 5 years for a flexible sigmoidoscopy or every 10 years for a colonoscopy beginning at age 72 years and continuing until age 47 years.  Hepatitis C blood test.** / For all people born from [birth year] through 1966 and any individual with known risks for hepatitis C.  Skin self-exam. / Monthly.  Influenza vaccine. / Every year.  Tetanus, diphtheria, and acellular pertussis (Tdap/Td) vaccine.** / Consult your health care provider. Pregnant women should receive 1 dose of Tdap vaccine during each pregnancy. 1 dose of Td every 10 years.  Varicella vaccine.** / Consult your health care provider. Pregnant females who do not have evidence of immunity should receive the first dose after pregnancy.  Zoster vaccine.** / 1 dose for adults aged 63 years or older.  Measles, mumps, rubella (MMR) vaccine.** / You need at least 1 dose of MMR if you  were born in [birth year] or later. You may also need a second dose. For females of childbearing age, rubella immunity should be determined. If there is no evidence of immunity, females who are not pregnant should be vaccinated. If there is no evidence of immunity, females who are pregnant should delay immunization until after pregnancy.  Pneumococcal 13-valent conjugate (PCV13) vaccine.** / Consult your health care provider.  Pneumococcal polysaccharide (PPSV23) vaccine.** / 1 to 2 doses if you smoke  cigarettes or if you have certain conditions.  Meningococcal vaccine.** / Consult your health care provider.  Hepatitis A vaccine.** / Consult your health care provider.  Hepatitis B vaccine.** / Consult your health care provider.  Haemophilus influenzae type b (Hib) vaccine.** / Consult your health care provider. Ages 7 years and over  Blood pressure check.** / Every year.  Lipid and cholesterol check.** / Every 5 years beginning at age 23 years.  Lung cancer screening. / Every year if you are aged 4-80 years and have a 30-pack-year history of smoking and currently smoke or have quit within the past 15 years. Yearly screening is stopped once you have quit smoking for at least 15 years or develop a health problem that would prevent you from having lung cancer treatment.  Clinical breast exam.** / Every year after age 31 years.  BRCA-related cancer risk assessment.** / For women who have family members with a BRCA-related cancer (breast, ovarian, tubal, or peritoneal cancers).  Mammogram.** / Every year beginning at age 29 years and continuing for as long as you are in good health. Consult with your health care provider.  Pap test.** / Every 3 years starting at age 19 years through age 45 or 62 years with 3 consecutive normal Pap tests. Testing can be stopped between 65 and 70 years with 3 consecutive normal Pap tests and no abnormal Pap or HPV tests in the past 10 years.  HPV screening.** / Every 3 years from ages 34 years through ages 52 or 65 years with a history of 3 consecutive normal Pap tests. Testing can be stopped between 65 and 70 years with 3 consecutive normal Pap tests and no abnormal Pap or HPV tests in the past 10 years.  Fecal occult blood test (FOBT) of stool. / Every year beginning at age 31 years and continuing until age 40 years. You may not need to do this test if you get a colonoscopy every 10 years.  Flexible sigmoidoscopy or colonoscopy.** / Every 5 years for a  flexible sigmoidoscopy or every 10 years for a colonoscopy beginning at age 25 years and continuing until age 71 years.  Hepatitis C blood test.** / For all people born from [birth year] through 1966 and any individual with known risks for hepatitis C.  Osteoporosis screening.** / A one-time screening for women ages 37 years and over and women at risk for fractures or osteoporosis.  Skin self-exam. / Monthly.  Influenza vaccine. / Every year.  Tetanus, diphtheria, and acellular pertussis (Tdap/Td) vaccine.** / 1 dose of Td every 10 years.  Varicella vaccine.** / Consult your health care provider.  Zoster vaccine.** / 1 dose for adults aged 58 years or older.  Pneumococcal 13-valent conjugate (PCV13) vaccine.** / Consult your health care provider.  Pneumococcal polysaccharide (PPSV23) vaccine.** / 1 dose for all adults aged 4 years and older.  Meningococcal vaccine.** / Consult your health care provider.  Hepatitis A vaccine.** / Consult your health care provider.  Hepatitis B vaccine.** / Consult your health care provider.  Haemophilus influenzae type b (Hib) vaccine.** / Consult your health care provider. ** Family history and personal history of risk and conditions may change your health care provider's recommendations.   This information is not intended to replace advice given to you by your health care provider. Make sure you discuss any questions you have with your health care provider.   Document Released: 03/25/2001 Document Revised: 02/17/2014 Document Reviewed: 06/24/2010 Elsevier Interactive Patient Education [employer].

## 2015-05-21 NOTE — Progress Notes (Signed)
Pre visit review using our clinic review tool, if applicable. No additional management support is needed unless otherwise documented below in the visit note. 

## 2015-05-21 NOTE — Progress Notes (Addendum)
Subjective:    Patient ID: Kristin Macdonald, female    DOB: March 26, 1970, 45 y.o.   MRN: [id number]  HPI  I have reviewed pt PMH, PSH, FH, Social History and Surgical History.  Pt is not presently fasting.  Pt had pap smear in past and not abnormal. Will get another pap smear this May.  Pt has mammogram in past. Aunts have history of breast cancer.  Pt is good on her flu vaccine. Pt up to date on her last tetanus.  LMP- 2 weeks. Pt cycles are  Regular every month. Pt thinks cycle are 3 days. This is usual pattern. Some night sweats recently. She thinks hot flashes.   Pt is exercising 3 days a week at the gym. She started weight watchers in January.     Review of Systems  Constitutional: Negative for fever, chills and fatigue.  Respiratory: Negative for cough, chest tightness, shortness of breath and wheezing.   Cardiovascular: Negative for chest pain and palpitations.  Gastrointestinal: Negative for abdominal pain.  Endocrine: Positive for heat intolerance. Negative for polydipsia, polyphagia and polyuria.       Hot flashes.  Skin: Negative for rash.       Dry skin elbows and back of her legs.  Neurological: Negative for dizziness, light-headedness, numbness and headaches.  Hematological: Negative for adenopathy. Does not bruise/bleed easily.  Psychiatric/Behavioral: Negative for behavioral problems and confusion.    Past Medical History  Diagnosis Date  . Anemia   . Anxiety state 01/09/2014    Social History   Social History  . Marital Status: Married    Spouse Name: N/A  . Number of Children: N/A  . Years of Education: N/A   Occupational History  . Not on file.   Social History Main Topics  . Smoking status: Never Smoker   . Smokeless tobacco: Not on file  . Alcohol Use: No  . Drug Use: No  . Sexual Activity: Yes   Other Topics Concern  . Not on file   Social History Narrative    Past Surgical History  Procedure Laterality Date  . Tubal ligation     . Tubal ligation      Family History  Problem Relation Age of Onset  . Hypertension Mother   . Cancer Father     No Known Allergies  No current outpatient prescriptions on file prior to visit.   No current facility-administered medications on file prior to visit.    BP 114/76 mmHg  Pulse 71  Temp(Src) 98.1 F (36.7 C) (Oral)  Ht 5\' 2"  (1.575 m)  Wt 153 lb 6.4 oz (69.582 kg)  BMI 28.05 kg/m2  SpO2 98%  LMP 05/07/2015      Objective:   Physical Exam  General Mental Status- Alert. General Appearance- Not in acute distress.   Skin General: Color- Normal Color. Moisture- Normal Moisture. Mild dry flakiness to elbows. And popliteal fossae. Some faint hypopigmented rt lower back/cva area.  Neck Carotid Arteries- Normal color. Moisture- Normal Moisture. No carotid bruits. No JVD.  Chest and Lung Exam Auscultation: Breath Sounds:-Normal.  Cardiovascular Auscultation:Rythm- Regular. Murmurs & Other Heart Sounds:Auscultation of the heart reveals- No Murmurs.  Abdomen Inspection:-Inspeection Normal. Palpation/Percussion:Note:No mass. Palpation and Percussion of the abdomen reveal- Non Tender, Non Distended + BS, no rebound or guarding.   Neurologic Cranial Nerve exam:- CN III-XII intact(No nystagmus), symmetric smile. Strength:- 5/5 equal and symmetric strength both upper and lower extremities.     Assessment & Plan:  Note after  lab review. May add fsh to evaluate for hot flashes. But will review cbc/wbc first.

## 2015-05-21 NOTE — Assessment & Plan Note (Signed)
Future fasting labs to be done. Including urine dip.

## 2015-05-23 ENCOUNTER — Telehealth: Payer: Self-pay | Admitting: Medical

## 2015-05-23 ENCOUNTER — Other Ambulatory Visit (INDEPENDENT_AMBULATORY_CARE_PROVIDER_SITE_OTHER): Payer: Federal, State, Local not specified - PPO

## 2015-05-23 DIAGNOSIS — R7989 Other specified abnormal findings of blood chemistry: Secondary | ICD-10-CM | POA: Diagnosis not present

## 2015-05-23 DIAGNOSIS — E559 Vitamin D deficiency, unspecified: Secondary | ICD-10-CM

## 2015-05-23 DIAGNOSIS — R232 Flushing: Secondary | ICD-10-CM

## 2015-05-23 DIAGNOSIS — Z0189 Encounter for other specified special examinations: Secondary | ICD-10-CM | POA: Diagnosis not present

## 2015-05-23 DIAGNOSIS — Z0000 Encounter for general adult medical examination without abnormal findings: Secondary | ICD-10-CM | POA: Diagnosis not present

## 2015-05-23 LAB — CBC WITH DIFFERENTIAL/PLATELET
BASOS PCT: 0.5 % (ref 0.0–3.0)
Basophils Absolute: 0 10*3/uL (ref 0.0–0.1)
EOS PCT: 3.3 % (ref 0.0–5.0)
Eosinophils Absolute: 0.1 10*3/uL (ref 0.0–0.7)
HCT: 37.3 % (ref 36.0–46.0)
Hemoglobin: 12.3 g/dL (ref 12.0–15.0)
Lymphocytes Relative: 54.9 % — ABNORMAL HIGH (ref 12.0–46.0)
Lymphs Abs: 2.3 10*3/uL (ref 0.7–4.0)
MCHC: 33 g/dL (ref 30.0–36.0)
MCV: 94.8 fl (ref 78.0–100.0)
MONO ABS: 0.5 10*3/uL (ref 0.1–1.0)
MONOS PCT: 11.7 % (ref 3.0–12.0)
NEUTROS ABS: 1.3 10*3/uL — AB (ref 1.4–7.7)
NEUTROS PCT: 29.6 % — AB (ref 43.0–77.0)
PLATELETS: 253 10*3/uL (ref 150.0–400.0)
RBC: 3.94 Mil/uL (ref 3.87–5.11)
RDW: 12.9 % (ref 11.5–15.5)
WBC: 4.2 10*3/uL (ref 4.0–10.5)

## 2015-05-23 LAB — COMPREHENSIVE METABOLIC PANEL
ALT: 13 U/L (ref 0–35)
AST: 17 U/L (ref 0–37)
Albumin: 3.8 g/dL (ref 3.5–5.2)
Alkaline Phosphatase: 40 U/L (ref 39–117)
BUN: 13 mg/dL (ref 6–23)
CHLORIDE: 105 meq/L (ref 96–112)
CO2: 29 mEq/L (ref 19–32)
Calcium: 9.1 mg/dL (ref 8.4–10.5)
Creatinine, Ser: 0.71 mg/dL (ref 0.40–1.20)
GFR: 114.2 mL/min (ref >60.00)
GLUCOSE: 77 mg/dL (ref 70–99)
Potassium: 4 mEq/L (ref 3.5–5.1)
SODIUM: 138 meq/L (ref 135–145)
TOTAL PROTEIN: 7.1 g/dL (ref 6.0–8.3)
Total Bilirubin: 0.6 mg/dL (ref 0.2–1.2)

## 2015-05-23 LAB — LIPID PANEL
CHOL/HDL RATIO: 2
Cholesterol: 143 mg/dL (ref 0–200)
HDL: 60.2 mg/dL (ref >39.00)
LDL Cholesterol: 73 mg/dL (ref 0–99)
NonHDL: 82.66
Triglycerides: 46 mg/dL (ref 0.0–149.0)
VLDL: 9.2 mg/dL (ref 0.0–40.0)

## 2015-05-23 LAB — TSH: TSH: 0.58 u[IU]/mL (ref 0.35–4.50)

## 2015-05-23 LAB — VITAMIN D 25 HYDROXY (VIT D DEFICIENCY, FRACTURES): VITD: 13.81 ng/mL — ABNORMAL LOW (ref 30.00–100.00)

## 2015-05-23 MED ORDER — VITAMIN D (ERGOCALCIFEROL) 1.25 MG (50000 UNIT) PO CAPS: 50000.0000 [IU] | ORAL_CAPSULE | ORAL | Status: DC

## 2015-05-23 NOTE — Telephone Encounter (Signed)
Vitamin d was sent in to her pharmacy. Repeat vitamin d in 12 weeks. Will you put future order in.

## 2015-05-23 NOTE — Addendum Note (Signed)
Addended by: Neldon LabellaMABE, HOLDEN S on: 05/23/2015 10:34 AM   Modules accepted: Orders

## 2015-05-23 NOTE — Addendum Note (Signed)
Addended by: Neldon LabellaMABE, Cosmo Tetreault S on: 05/23/2015 10:39 AM   Modules accepted: Orders

## 2015-05-24 MED FILL — VIT D2 1.25 MG (50,000 UNIT: 1.25 MG | 84 days supply | Qty: 12 | Fill #0

## 2015-05-24 NOTE — Telephone Encounter (Signed)
Notified pt and scheduled lab appt for 08/16/15 at 7am, future order entered. Pt states she talked with PCP about getting lab testing for the hot flashes she has been having. Wants to know if she can do that when she returns for the vitamin d recheck?  If so, what labs do you want ordered?

## 2015-05-24 NOTE — Telephone Encounter (Signed)
Spoke with pt and advised her that the blood work could be run the same day when she comes in to recheck her vitamin D level. Pt voices understanding.

## 2015-05-24 NOTE — Telephone Encounter (Signed)
Yes can do fsh when recheck vitamin d level. Will you put in future fsh. Associate with hot flashes.

## 2015-07-31 LAB — HM MAMMOGRAPHY

## 2015-08-16 ENCOUNTER — Other Ambulatory Visit (INDEPENDENT_AMBULATORY_CARE_PROVIDER_SITE_OTHER): Payer: Federal, State, Local not specified - PPO

## 2015-08-16 DIAGNOSIS — E559 Vitamin D deficiency, unspecified: Secondary | ICD-10-CM

## 2015-08-16 DIAGNOSIS — R232 Flushing: Secondary | ICD-10-CM

## 2015-08-16 DIAGNOSIS — N951 Menopausal and female climacteric states: Secondary | ICD-10-CM

## 2015-08-16 LAB — FOLLICLE STIMULATING HORMONE: FSH: 63.4 m[IU]/mL

## 2015-08-16 LAB — VITAMIN D 25 HYDROXY (VIT D DEFICIENCY, FRACTURES): VITD: 34.59 ng/mL (ref 30.00–100.00)

## 2016-04-24 ENCOUNTER — Ambulatory Visit (INDEPENDENT_AMBULATORY_CARE_PROVIDER_SITE_OTHER): Payer: Federal, State, Local not specified - PPO | Admitting: Medical

## 2016-04-24 VITALS — BP 112/75 | HR 95 | Temp 99.2°F | Ht 65.2 in | Wt 160.2 lb

## 2016-04-24 DIAGNOSIS — J069 Acute upper respiratory infection, unspecified: Secondary | ICD-10-CM

## 2016-04-24 DIAGNOSIS — J029 Acute pharyngitis, unspecified: Secondary | ICD-10-CM

## 2016-04-24 MED ORDER — FLUTICASONE PROPIONATE 50 MCG/ACT NA SUSP: 2.0000 | Freq: Every day | NASAL | 1 refills | Status: DC

## 2016-04-24 MED ORDER — AZITHROMYCIN 250 MG PO TABS: ORAL_TABLET | ORAL | 0 refills | Status: DC

## 2016-04-24 MED ORDER — HYDROCODONE-HOMATROPINE 5-1.5 MG/5ML PO SYRP: 5.0000 mL | ORAL_SOLUTION | Freq: Three times a day (TID) | ORAL | 0 refills | Status: DC | PRN

## 2016-04-24 MED FILL — AZITHROMYCIN 250 MG TABLET: 250 | 5 days supply | Qty: 6 | Fill #0

## 2016-04-24 MED FILL — HYDROCODONE-HOMATROPINE SYR: 5-1.5 | 8 days supply | Qty: 120 | Fill #0

## 2016-04-24 MED FILL — FLUTICASONE PROP 50 MCG SPR: 50 | 30 days supply | Qty: 16 | Fill #0

## 2016-04-24 NOTE — Progress Notes (Signed)
Pre visit review using our clinic tool,if applicable. No additional management support is needed unless otherwise documented below in the visit note.  

## 2016-04-24 NOTE — Patient Instructions (Addendum)
Uri vs bronchitis. You report feeling as if getting better today   I think st related to post nasal drainage seen on exam and from coughing  Will rx flonase for nasal congestion and hycodan for cough.  If you have worse symptoms as discussed then start azithromycin antibiotic.  Follow up in 7 days or as needed.

## 2016-04-24 NOTE — Progress Notes (Signed)
Subjective:    Patient ID: Kristin Macdonald, female    DOB: 1970/10/11, 46 y.o.   MRN: [id number]  HPI  Pt in with some faint st, pt also  has moderate cough with some production and possible mild chest congestion. Pt can taste the mucous when coughs. No sinus pressure. Minimal nasal congestion. Rare sneeze but not itching eyes.  Pt feels little better last 24 hours.  Review of Systems  Constitutional: Positive for fatigue. Negative for chills, diaphoresis and fever.       Faint fatigue.  HENT: Positive for sore throat.   Respiratory: Positive for cough. Negative for choking, chest tightness, shortness of breath and wheezing.        Keeping her up.  Cardiovascular: Negative for chest pain and palpitations.  Gastrointestinal: Negative for abdominal pain.  Musculoskeletal: Negative for back pain, myalgias and neck stiffness.  Neurological: Negative for dizziness, syncope, weakness, numbness and headaches.       Faint ha when coughs.  Hematological: Negative for adenopathy. Does not bruise/bleed easily.  Psychiatric/Behavioral: Negative for agitation, behavioral problems and confusion.    Past Medical History:  Diagnosis Date  . Anemia   . Anxiety state 01/09/2014     Social History   Social History  . Marital status: Married    Spouse name: N/A  . Number of children: N/A  . Years of education: N/A   Occupational History  . Not on file.   Social History Main Topics  . Smoking status: Never Smoker  . Smokeless tobacco: Not on file  . Alcohol use No  . Drug use: No  . Sexual activity: Yes   Other Topics Concern  . Not on file   Social History Narrative  . No narrative on file    Past Surgical History:  Procedure Laterality Date  . TUBAL LIGATION    . TUBAL LIGATION      Family History  Problem Relation Age of Onset  . Hypertension Mother   . Cancer Father     No Known Allergies  Current Outpatient Prescriptions on File Prior to Visit  Medication Sig  Dispense Refill  . ammonium lactate (LAC-HYDRIN) 12 % cream Apply topically as needed for dry skin. Apply to area twice daily. (Patient not taking: Reported on 04/24/2016) 385 g 0  . Vitamin D, Ergocalciferol, (DRISDOL) 50000 units CAPS capsule Take 1 capsule (50,000 Units total) by mouth every 7 (seven) days. (Patient not taking: Reported on 04/24/2016) 12 capsule 0   No current facility-administered medications on file prior to visit.     BP 112/75   Pulse 95   Temp 99.2 F (37.3 C) (Oral)   Ht 5' 5.2" (1.656 m)   Wt 160 lb 3.2 oz (72.7 kg)   LMP 04/18/2016   SpO2 100%   BMI 26.50 kg/m       Objective:   Physical Exam  General  Mental Status - Alert. General Appearance - Well groomed. Not in acute distress.(sounds nasal congested)  Skin Rashes- No Rashes.  HEENT Head- Normal. Ear Auditory Canal - Left- Normal. Right - Normal.Tympanic Membrane- Left- Normal. Right- Normal. Eye Sclera/Conjunctiva- Left- Normal. Right- Normal. Nose & Sinuses Nasal Mucosa- Left-  Boggy and Congested. Right-  Boggy and  Congested.Bilateral  No maxillary and no  frontal sinus pressure. Mouth & Throat Lips: Upper Lip- Normal: no dryness, cracking, pallor, cyanosis, or vesicular eruption. Lower Lip-Normal: no dryness, cracking, pallor, cyanosis or vesicular eruption. Buccal Mucosa- Bilateral- No Aphthous ulcers. Oropharynx-  No Discharge or Erythema. +pnd Tonsils: Characteristics- Bilateral- No Erythema or Congestion. Size/Enlargement- Bilateral- No enlargement. Discharge- bilateral-None.  Neck Neck- Supple. No Masses.   Chest and Lung Exam Auscultation: Breath Sounds:-Clear even and unlabored.  Cardiovascular Auscultation:Rythm- Regular, rate and rhythm. Murmurs & Other Heart Sounds:Ausculatation of the heart reveal- No Murmurs.  Lymphatic Head & Neck General Head & Neck Lymphatics: Bilateral: Description- No Localized lymphadenopathy.       Assessment & Plan:  [place] Peers vs  bronchitis. You report feeling as if getting better today.  I think st related to post nasal drainage seen on exam and from coughing  Will rx flonase for nasal congestion and hycodan for cough.  If you have worse symptoms as discussed then start azithromycin antibiotic.  Follow up in 7 days or as needed.  Larine Fielding, Ramon Dredge, PA-C

## 2016-04-25 ENCOUNTER — Telehealth: Payer: Self-pay | Admitting: Medical

## 2016-04-25 MED ORDER — BENZONATATE 100 MG PO CAPS: 100.0000 mg | ORAL_CAPSULE | Freq: Three times a day (TID) | ORAL | 0 refills | Status: DC | PRN

## 2016-04-25 NOTE — Telephone Encounter (Signed)
Relation to KV:[id number]:self Call back [phone]   Reason for call:  Patient was last seen 04/24/16 by Ramon DredgeEdward and states HYDROcodone-homatropine [facility](HYCODAN) 5-1.5 MG/5ML syrup is causing her to vomit, please advise

## 2016-04-25 NOTE — Telephone Encounter (Signed)
Stop the hycodan if she is convinced that causes nausea. Will rx benzonatate for cough in place of. Notify pt. Got to address after hours on Friday. Apologize for delay.

## 2016-04-28 NOTE — Telephone Encounter (Signed)
LMOM with contact name and number for return call, if needed RE: medication problem & new Rx to pharmacy per provider instructions/SLS 03/19

## 2016-06-02 ENCOUNTER — Telehealth: Payer: Self-pay | Admitting: Medical

## 2016-06-02 ENCOUNTER — Ambulatory Visit (INDEPENDENT_AMBULATORY_CARE_PROVIDER_SITE_OTHER): Payer: Federal, State, Local not specified - PPO | Admitting: Medical

## 2016-06-02 ENCOUNTER — Encounter: Payer: Self-pay | Admitting: Medical

## 2016-06-02 VITALS — BP 105/74 | HR 73 | Temp 98.1°F | Resp 16 | Ht 65.0 in | Wt 159.8 lb

## 2016-06-02 DIAGNOSIS — R7989 Other specified abnormal findings of blood chemistry: Secondary | ICD-10-CM

## 2016-06-02 DIAGNOSIS — E559 Vitamin D deficiency, unspecified: Secondary | ICD-10-CM | POA: Diagnosis not present

## 2016-06-02 DIAGNOSIS — Z0000 Encounter for general adult medical examination without abnormal findings: Secondary | ICD-10-CM | POA: Diagnosis not present

## 2016-06-02 NOTE — Progress Notes (Signed)
Pre visit review using our clinic review tool, if applicable. No additional management support is needed unless otherwise documented below in the visit note. 

## 2016-06-02 NOTE — Patient Instructions (Addendum)
For you wellness exam today I have ordered cbc, cmp, tsh, lipid panel, ua and hiv.(include also vitamin d level)  Vaccine appear up to date.  Recommend exercise and healthy diet.  We will let you know lab results as they come in.  Follow up date appointment will be determined after lab review.    Preventive Care 40-64 Years, Female Preventive care refers to lifestyle choices and visits with your health care provider that can promote health and wellness. What does preventive care include?  A yearly physical exam. This is also called an annual well check.  Dental exams once or twice a year.  Routine eye exams. Ask your health care provider how often you should have your eyes checked.  Personal lifestyle choices, including:  Daily care of your teeth and gums.  Regular physical activity.  Eating a healthy diet.  Avoiding tobacco and drug use.  Limiting alcohol use.  Practicing safe sex.  Taking low-dose aspirin daily starting at age 30.  Taking vitamin and mineral supplements as recommended by your health care provider. What happens during an annual well check? The services and screenings done by your health care provider during your annual well check will depend on your age, overall health, lifestyle risk factors, and family history of disease. Counseling  Your health care provider may ask you questions about your:  Alcohol use.  Tobacco use.  Drug use.  Emotional well-being.  Home and relationship well-being.  Sexual activity.  Eating habits.  Work and work Statistician.  Method of birth control.  Menstrual cycle.  Pregnancy history. Screening  You may have the following tests or measurements:  Height, weight, and BMI.  Blood pressure.  Lipid and cholesterol levels. These may be checked every 5 years, or more frequently if you are over 65 years old.  Skin check.  Lung cancer screening. You may have this screening every year starting at age 40 if  you have a 30-pack-year history of smoking and currently smoke or have quit within the past 15 years.  Fecal occult blood test (FOBT) of the stool. You may have this test every year starting at age 26.  Flexible sigmoidoscopy or colonoscopy. You may have a sigmoidoscopy every 5 years or a colonoscopy every 10 years starting at age 56.  Hepatitis C blood test.  Hepatitis B blood test.  Sexually transmitted disease (STD) testing.  Diabetes screening. This is done by checking your blood sugar (glucose) after you have not eaten for a while (fasting). You may have this done every 1-3 years.  Mammogram. This may be done every 1-2 years. Talk to your health care provider about when you should start having regular mammograms. This may depend on whether you have a family history of breast cancer.  BRCA-related cancer screening. This may be done if you have a family history of breast, ovarian, tubal, or peritoneal cancers.  Pelvic exam and Pap test. This may be done every 3 years starting at age 64. Starting at age 68, this may be done every 5 years if you have a Pap test in combination with an HPV test.  Bone density scan. This is done to screen for osteoporosis. You may have this scan if you are at high risk for osteoporosis. Discuss your test results, treatment options, and if necessary, the need for more tests with your health care provider. Vaccines  Your health care provider may recommend certain vaccines, such as:  Influenza vaccine. This is recommended every year.  Tetanus, diphtheria,  and acellular pertussis (Tdap, Td) vaccine. You may need a Td booster every 10 years.  Varicella vaccine. You may need this if you have not been vaccinated.  Zoster vaccine. You may need this after age 39.  Measles, mumps, and rubella (MMR) vaccine. You may need at least one dose of MMR if you were born in 1957 or later. You may also need a second dose.  Pneumococcal 13-valent conjugate (PCV13)  vaccine. You may need this if you have certain conditions and were not previously vaccinated.  Pneumococcal polysaccharide (PPSV23) vaccine. You may need one or two doses if you smoke cigarettes or if you have certain conditions.  Meningococcal vaccine. You may need this if you have certain conditions.  Hepatitis A vaccine. You may need this if you have certain conditions or if you travel or work in places where you may be exposed to hepatitis A.  Hepatitis B vaccine. You may need this if you have certain conditions or if you travel or work in places where you may be exposed to hepatitis B.  Haemophilus influenzae type b (Hib) vaccine. You may need this if you have certain conditions. Talk to your health care provider about which screenings and vaccines you need and how often you need them. This information is not intended to replace advice given to you by your health care provider. Make sure you discuss any questions you have with your health care provider. Document Released: 02/23/2015 Document Revised: 10/17/2015 Document Reviewed: 11/28/2014 Elsevier Interactive Patient Education  2017 [employer].

## 2016-06-02 NOTE — Telephone Encounter (Signed)
This the abstract mammogram and pap smear patient. Done 2017.

## 2016-06-02 NOTE — Progress Notes (Signed)
Subjective:    Patient ID: Kristin Macdonald, female    DOB: 1970/12/18, 46 y.o.   MRN: [id number]  HPI   Pt in wellness exam.  No acute problems. She states some irregular menses. Hx of fsh high.  Pt is due for mammogram a year ago and study was normal. Done in [place].  Pt last pap smear done last year and was normal. Done by Dr. Baxter Kail July 31, 2015 He is in [place]. Gynecologist. He is aware pt fsh elevated.  Pt has not been exercising much but plans to work up more. Pt admits diet not well controlled.    Review of Systems  HENT: Negative for congestion, facial swelling, nosebleeds, rhinorrhea, sneezing and sore throat.        Left ear some wax obstruction.  Respiratory: Negative for cough, chest tightness, shortness of breath and wheezing.   Cardiovascular: Negative for chest pain and palpitations.  Gastrointestinal: Negative for abdominal distention, abdominal pain, blood in stool, constipation, diarrhea and vomiting.  Genitourinary: Negative for dyspareunia and dysuria.  Musculoskeletal: Negative for back pain, myalgias and neck pain.  Skin: Negative for rash.  Neurological: Negative for dizziness and seizures.  Hematological: Negative for adenopathy. Does not bruise/bleed easily.  Psychiatric/Behavioral: Positive for sleep disturbance. Negative for behavioral problems, decreased concentration and hallucinations. The patient is not nervous/anxious.        Maybe related to working overtime. She uses melatonin. It helps.     Past Medical History:  Diagnosis Date  . Anemia   . Anxiety state 01/09/2014     Social History   Social History  . Marital status: Married    Spouse name: N/A  . Number of children: N/A  . Years of education: N/A   Occupational History  . Not on file.   Social History Main Topics  . Smoking status: Never Smoker  . Smokeless tobacco: Never Used  . Alcohol use No  . Drug use: No  . Sexual activity: Yes   Other Topics Concern   . Not on file   Social History Narrative  . No narrative on file    Past Surgical History:  Procedure Laterality Date  . TUBAL LIGATION    . TUBAL LIGATION      Family History  Problem Relation Age of Onset  . Hypertension Mother   . Cancer Father     No Known Allergies  No current outpatient prescriptions on file prior to visit.   No current facility-administered medications on file prior to visit.     BP 105/74 (BP Location: Left Arm, Patient Position: Sitting, Cuff Size: Normal)   Pulse 73   Temp 98.1 F (36.7 C) (Oral)   Resp 16   Ht  (1.651 m)   Wt 159 lb 12.8 oz (72.5 kg)   SpO2 100%   BMI 26.59 kg/m        Objective:   Physical Exam  General Mental Status- Alert. General Appearance- Not in acute distress.   Marland Kitchen  HEENT Head- Normal. Ear Auditory Canal - Left- Normal. Right - Normal.Tympanic Membrane- Left- wax removed with currette. Right- Normal. Eye Sclera/Conjunctiva- Left- Normal. Right- Normal. Nose & Sinuses Nasal Mucosa- Left-   Not Boggy and Congested. Right- Not  Boggy and  Congested.Bilateral not maxillary and not  frontal sinus pressure. Mouth & Throat Lips: Upper Lip- Normal: no dryness, cracking, pallor, cyanosis, or vesicular eruption. Lower Lip-Normal: no dryness, cracking, pallor, cyanosis or vesicular eruption. Buccal Mucosa- Bilateral-  No Aphthous ulcers. Oropharynx- No Discharge or Erythema. Tonsils: Characteristics- Bilateral- No Erythema or Congestion. Size/Enlargement- Bilateral- No enlargement. Discharge- bilateral-None.    Skin General: Color- Normal Color. Moisture- Normal Moisture.  Neck Carotid Arteries- Normal color. Moisture- Normal Moisture. No carotid bruits. No JVD.  Chest and Lung Exam Auscultation: Breath Sounds:-Normal.  Cardiovascular Auscultation:Rythm- Regular. Murmurs & Other Heart Sounds:Auscultation of the heart reveals- No Murmurs.  Abdomen Inspection:-Inspeection  Normal. Palpation/Percussion:Note:No mass. Palpation and Percussion of the abdomen reveal- Non Tender, Non Distended + BS, no rebound or guarding.  Neurologic Cranial Nerve exam:- CN III-XII intact(No nystagmus), symmetric smile. Strength:- 5/5 equal and symmetric strength both upper and lower extremities.      Assessment & Plan:  For you wellness exam today I have ordered cbc, cmp, tsh, lipid panel, ua and hiv.(include also vitamin d level)  Vaccine appear up to date.  Recommend exercise and healthy diet.  We will let you know lab results as they come in.  Follow up date appointment will be determined after lab review.   Korbyn Vanes, Ramon Dredge, PA-C

## 2016-06-03 ENCOUNTER — Other Ambulatory Visit (INDEPENDENT_AMBULATORY_CARE_PROVIDER_SITE_OTHER): Payer: Federal, State, Local not specified - PPO

## 2016-06-03 DIAGNOSIS — Z0000 Encounter for general adult medical examination without abnormal findings: Secondary | ICD-10-CM

## 2016-06-03 DIAGNOSIS — R7989 Other specified abnormal findings of blood chemistry: Secondary | ICD-10-CM

## 2016-06-03 LAB — LIPID PANEL
Cholesterol: 169 mg/dL (ref 0–200)
HDL: 67.1 mg/dL (ref >39.00)
LDL CALC: 88 mg/dL (ref 0–99)
NONHDL: 101.63
TRIGLYCERIDES: 67 mg/dL (ref 0.0–149.0)
Total CHOL/HDL Ratio: 3
VLDL: 13.4 mg/dL (ref 0.0–40.0)

## 2016-06-03 LAB — POC URINALSYSI DIPSTICK (AUTOMATED)
Bilirubin, UA: NEGATIVE
GLUCOSE UA: NEGATIVE
Ketones, UA: NEGATIVE
Leukocytes, UA: NEGATIVE
NITRITE UA: NEGATIVE
PH UA: 6.5 (ref 5.0–8.0)
RBC UA: NEGATIVE
SPEC GRAV UA: 1.02 (ref 1.010–1.025)
Urobilinogen, UA: 0.2 E.U./dL

## 2016-06-03 LAB — CBC WITH DIFFERENTIAL/PLATELET
Basophils Absolute: 0 10*3/uL (ref 0.0–0.1)
Basophils Relative: 0.8 % (ref 0.0–3.0)
EOS PCT: 5.1 % — AB (ref 0.0–5.0)
Eosinophils Absolute: 0.2 10*3/uL (ref 0.0–0.7)
HCT: 37.5 % (ref 36.0–46.0)
HEMOGLOBIN: 12.5 g/dL (ref 12.0–15.0)
LYMPHS ABS: 1.7 10*3/uL (ref 0.7–4.0)
Lymphocytes Relative: 47.4 % — ABNORMAL HIGH (ref 12.0–46.0)
MCHC: 33.4 g/dL (ref 30.0–36.0)
MCV: 94.8 fl (ref 78.0–100.0)
MONOS PCT: 11.9 % (ref 3.0–12.0)
Monocytes Absolute: 0.4 10*3/uL (ref 0.1–1.0)
NEUTROS PCT: 34.8 % — AB (ref 43.0–77.0)
Neutro Abs: 1.3 10*3/uL — ABNORMAL LOW (ref 1.4–7.7)
Platelets: 305 10*3/uL (ref 150.0–400.0)
RBC: 3.96 Mil/uL (ref 3.87–5.11)
RDW: 12.7 % (ref 11.5–15.5)
WBC: 3.7 10*3/uL — ABNORMAL LOW (ref 4.0–10.5)

## 2016-06-03 LAB — HIV ANTIBODY (ROUTINE TESTING W REFLEX): HIV 1&2 Ab, 4th Generation: NONREACTIVE

## 2016-06-03 LAB — COMPREHENSIVE METABOLIC PANEL
ALBUMIN: 3.8 g/dL (ref 3.5–5.2)
ALK PHOS: 41 U/L (ref 39–117)
ALT: 13 U/L (ref 0–35)
AST: 17 U/L (ref 0–37)
BUN: 8 mg/dL (ref 6–23)
CO2: 29 mEq/L (ref 19–32)
CREATININE: 0.61 mg/dL (ref 0.40–1.20)
Calcium: 9 mg/dL (ref 8.4–10.5)
Chloride: 105 mEq/L (ref 96–112)
GFR: 135.45 mL/min (ref >60.00)
Glucose, Bld: 77 mg/dL (ref 70–99)
POTASSIUM: 3.9 meq/L (ref 3.5–5.1)
Sodium: 138 mEq/L (ref 135–145)
TOTAL PROTEIN: 7 g/dL (ref 6.0–8.3)
Total Bilirubin: 0.6 mg/dL (ref 0.2–1.2)

## 2016-06-03 LAB — TSH: TSH: 0.51 u[IU]/mL (ref 0.35–4.50)

## 2016-06-05 ENCOUNTER — Telehealth: Payer: Self-pay | Admitting: Medical

## 2016-06-05 NOTE — Telephone Encounter (Signed)
°  Relation to ZO:XWRU Call back [phone]   Reason for call:  Patient inquiring about lab results

## 2016-06-07 LAB — VITAMIN D 1,25 DIHYDROXY
VITAMIN D 1, 25 (OH) TOTAL: 59 pg/mL (ref 18–72)
Vitamin D2 1, 25 (OH)2: 17 pg/mL
Vitamin D3 1, 25 (OH)2: 42 pg/mL

## 2016-08-03 ENCOUNTER — Telehealth: Payer: Self-pay | Admitting: Medical

## 2016-08-03 NOTE — Telephone Encounter (Signed)
Will you call and remind pt I put in order in April to get her mammogram. Computer states she never got done. Will you give her number to mammogram dept so she can call and schedule.

## 2016-08-04 NOTE — Telephone Encounter (Signed)
Pt sates she has an appointment on July 10th for mammogram she will have office fax over results.

## 2016-08-04 NOTE — Telephone Encounter (Signed)
Left pt a message to call back. 

## 2016-11-19 ENCOUNTER — Ambulatory Visit (INDEPENDENT_AMBULATORY_CARE_PROVIDER_SITE_OTHER): Payer: Federal, State, Local not specified - PPO | Admitting: Medical

## 2016-11-19 ENCOUNTER — Encounter: Payer: Self-pay | Admitting: Medical

## 2016-11-19 ENCOUNTER — Other Ambulatory Visit: Payer: Self-pay | Admitting: Medical

## 2016-11-19 ENCOUNTER — Ambulatory Visit (HOSPITAL_BASED_OUTPATIENT_CLINIC_OR_DEPARTMENT_OTHER)
Admission: RE | Admit: 2016-11-19 | Discharge: 2016-11-19 | Disposition: A | Discharge status: Home / Self Care | Payer: Federal, State, Local not specified - PPO | Source: Ambulatory Visit | Attending: Medical | Admitting: Medical

## 2016-11-19 VITALS — BP 106/74 | HR 66 | Temp 98.7°F | Ht 62.6 in | Wt 164.0 lb

## 2016-11-19 DIAGNOSIS — M25562 Pain in left knee: Principal | ICD-10-CM

## 2016-11-19 DIAGNOSIS — G8929 Other chronic pain: Secondary | ICD-10-CM | POA: Insufficient documentation

## 2016-11-19 DIAGNOSIS — M25561 Pain in right knee: Secondary | ICD-10-CM

## 2016-11-19 NOTE — Patient Instructions (Addendum)
For both knee pain and rt side crepitus, I will get xray of both sides. If pain increases alleve.  Rest and modify work outs to protect knees.  Will refer you to sports med.  Follow up date to be determined after x-ray review  Flu vaccine declined.  Also will see derm to evaluate possible lipoma.(Appointment already scheduled)

## 2016-11-19 NOTE — Progress Notes (Signed)
   Subjective:    Patient ID: Kristin Macdonald, female    DOB: [birth year], 46 y.o.   MRN: [id number]  HPI  Pt in for some knee pain. She started a high impact type exercise. After one month her knees started to hurt. Then just recently she felt crunchy type sound to her rt knee.   Left knee hurt end of august. It has gotten some better. But rt knee is more tender know.  She states was doing circuit training when knees started to hurt.  LMP-Sept [id number].  Review of Systems  Constitutional: Negative for chills, fatigue and fever.  HENT: Negative for congestion, drooling and facial swelling.   Respiratory: Negative for cough, chest tightness and wheezing.   Cardiovascular: Negative for chest pain and palpitations.  Gastrointestinal: Negative for abdominal pain.  Musculoskeletal: Negative for back pain, gait problem, joint swelling, myalgias and neck stiffness.       Knee pain.  Skin: Negative for rash.       Left scalp- frontal area. Small lump.   Neurological: Negative for dizziness, weakness, numbness and headaches.  Hematological: Negative for adenopathy. Does not bruise/bleed easily.  Psychiatric/Behavioral: Negative for behavioral problems, confusion, dysphoric mood, hallucinations and sleep disturbance. The patient is not nervous/anxious.     Past Medical History:  Diagnosis Date  . Anemia   . Anxiety state 01/09/2014     Social History   Social History  . Marital status: Married    Spouse name: N/A  . Number of children: N/A  . Years of education: N/A   Occupational History  . Not on file.   Social History Main Topics  . Smoking status: Never Smoker  . Smokeless tobacco: Never Used  . Alcohol use No  . Drug use: No  . Sexual activity: Yes   Other Topics Concern  . Not on file   Social History Narrative  . No narrative on file    Past Surgical History:  Procedure Laterality Date  . TUBAL LIGATION    . TUBAL LIGATION      Family History  Problem  Relation Age of Onset  . Hypertension Mother   . Cancer Father     No Known Allergies  No current outpatient prescriptions on file prior to visit.   No current facility-administered medications on file prior to visit.     BP 106/74   Pulse 66   Temp 98.7 F (37.1 C) (Oral)   Ht 5' 2.6" (1.59 m)   Wt 164 lb (74.4 kg)   LMP 11/01/2016   SpO2 99%   BMI 29.42 kg/m       Objective:   Physical Exam  General- No acute distress. Pleasant patient.  Lungs- Clear, even and unlabored. Heart- regular rate and rhythm. Neurologic- CNII- XII grossly intact.  Rt knee- severe crepitus. No instability. Faint swelling.  Left knee- no crepitus on range of motion.  Skin- small tiny lump about 3mm in size. Feels like early lipoma.      Assessment & Plan:  For both knee pain and rt side crepitus, I will get xray of both sides. If pain increases alleve.  Rest and modify work outs to protect knees.  Will refer you to sports med.  Follow up date to be determined after x-ray review.  Flu vaccine declined.  Also will see derm to evaluate possible lipoma.(Appointment already scheduled)  Kerin Cecchi, Ramon Dredge, PA-C

## 2016-11-20 ENCOUNTER — Ambulatory Visit: Payer: Self-pay | Admitting: Medical

## 2016-11-20 ENCOUNTER — Ambulatory Visit (INDEPENDENT_AMBULATORY_CARE_PROVIDER_SITE_OTHER): Payer: Federal, State, Local not specified - PPO | Admitting: Family Medicine

## 2016-11-20 ENCOUNTER — Encounter: Payer: Self-pay | Admitting: Family Medicine

## 2016-11-20 DIAGNOSIS — M25562 Pain in left knee: Secondary | ICD-10-CM

## 2016-11-20 DIAGNOSIS — M25561 Pain in right knee: Secondary | ICD-10-CM

## 2016-11-20 NOTE — Patient Instructions (Signed)
Your pain is due to arthritis. These are the different medications you can take for this: Tylenol  1-2 tabs three times a day for pain. Capsaicin, aspercreme, or biofreeze topically up to four times a day may also help with pain. Some supplements that may help for arthritis: Boswellia extract, curcumin, pycnogenol Aleve 1-2 tabs twice a day with food Cortisone injections are an option. If cortisone injections do not help, there are different types of shots that may help but they take longer to take effect. It's important that you continue to stay active. Straight leg raises, knee extensions, hamstring curls 3 sets of 10 once a day (add ankle weight if these become too easy). Avoid deep squats, deep lunges, leg press for next 6 weeks. Consider physical therapy to strengthen muscles around the joint that hurts to take pressure off of the joint itself. Shoe inserts with good arch support may be helpful. Heat or ice 15 minutes at a time 3-4 times a day as needed to help with pain. Water aerobics and cycling with low resistance are the best two types of exercise for arthritis though any exercise is ok as long as it doesn't worsen the pain. Follow up with me in 6 weeks for reevaluation.

## 2016-11-22 DIAGNOSIS — M25562 Pain in left knee: Secondary | ICD-10-CM

## 2016-11-22 DIAGNOSIS — M25561 Pain in right knee: Secondary | ICD-10-CM | POA: Insufficient documentation

## 2016-11-22 NOTE — Assessment & Plan Note (Signed)
2/2 arthritis.  Discussed tylenol, topical medications, supplements, aleve.  Shown home exercises to do daily and reviewed what to avoid for now.  Consider injection(s) if pain worsens).  Consider physical therapy as well.  Heat/ice.  F/u in 6 weeks for reevaluation.  Total visit time 40 minutes - >50% of which spent on counseling.

## 2016-11-22 NOTE — Progress Notes (Signed)
PCP and consultation requested by: Esperanza Richters, PA-C  Subjective:   HPI: Patient is a 46 y.o. female here for bilateral knee pain.  Patient reports she's been doing a 90 day challenge and circuit training. She started to get pain anterior left knee and started to use a knee brace. Then noticed pain anterior right knee as well. Tried new shoes, better kknee brace. Associated swelling and stiffness. Discomfort is worse with stairs. Taking a joint supplement. Pain level up to 3-4/10 at worst. No prior issues with her knees. Pain started about 2 months ago. No skin changes, numbness.  Past Medical History:  Diagnosis Date  . Anemia   . Anxiety state 01/09/2014    Current Outpatient Prescriptions on File Prior to Visit  Medication Sig Dispense Refill  . Misc Natural Products (JOINT SUPPORT COMPLEX) CAPS Take by mouth.     No current facility-administered medications on file prior to visit.     Past Surgical History:  Procedure Laterality Date  . TUBAL LIGATION    . TUBAL LIGATION      No Known Allergies  Social History   Social History  . Marital status: Married    Spouse name: N/A  . Number of children: N/A  . Years of education: N/A   Occupational History  . Not on file.   Social History Main Topics  . Smoking status: Never Smoker  . Smokeless tobacco: Never Used  . Alcohol use No  . Drug use: No  . Sexual activity: Yes   Other Topics Concern  . Not on file   Social History Narrative  . No narrative on file    Family History  Problem Relation Age of Onset  . Hypertension Mother   . Cancer Father     BP 114/75   Ht 5' 2.5" (1.588 m)   Wt 164 lb (74.4 kg)   LMP 11/01/2016   BMI 29.52 kg/m   Review of Systems: See HPI above.     Objective:  Physical Exam:  Gen: NAD, comfortable in exam room  Right knee: No gross deformity, ecchymoses, effusion. Minimal tenderness medial joint line.  No other tenderness. FROM. Negative ant/post  drawers. Negative valgus/varus testing. Negative lachmanns. Negative mcmurrays, apleys, patellar apprehension. NV intact distally.  Left knee: No gross deformity, ecchymoses, swelling. Minimal tenderness medial joint line.  No other tenderness. FROM. Negative ant/post drawers. Negative valgus/varus testing. Negative lachmanns. Negative mcmurrays, apleys, patellar apprehension. NV intact distally.   Assessment & Plan:  1. Bilateral knee pain - 2/2 arthritis.  Discussed tylenol, topical medications, supplements, aleve.  Shown home exercises to do daily and reviewed what to avoid for now.  Consider injection(s) if pain worsens).  Consider physical therapy as well.  Heat/ice.  F/u in 6 weeks for reevaluation.  Total visit time 40 minutes - >50% of which spent on counseling.

## 2017-01-05 ENCOUNTER — Encounter: Payer: Self-pay | Admitting: Family Medicine

## 2017-01-05 ENCOUNTER — Ambulatory Visit: Payer: Federal, State, Local not specified - PPO | Admitting: Family Medicine

## 2017-01-05 DIAGNOSIS — M25561 Pain in right knee: Secondary | ICD-10-CM

## 2017-01-05 DIAGNOSIS — M25562 Pain in left knee: Secondary | ICD-10-CM | POA: Diagnosis not present

## 2017-01-05 NOTE — Patient Instructions (Signed)
You're doing great! Use topical capsaicin, aspercreme, or biofreeze topically up to four times a day as first line for pain. Continue with supplement as you have been. I would take tylenol 500mg  1-2 tabs up to 3 times a day if you needed a medicine. Aleve 1-2 tabs twice a day with food for a really bad day. It's important that you continue to stay active. Straight leg raises, knee extensions 3 sets of 10 once a day (add ankle weight if these become too easy). Ok for classes, squats, lunges, leg press but I'd start at about 50% of your usual time (as we discussed) and increase from there. Consider physical therapy to strengthen muscles around the joint that hurts to take pressure off of the joint itself. Shoe inserts with good arch support may be helpful. Heat or ice 15 minutes at a time 3-4 times a day as needed to help with pain. Follow up with me as needed.

## 2017-01-05 NOTE — Progress Notes (Signed)
PCP and consultation requested by: Esperanza RichtersSaguier, Edward, PA-C  Subjective:   HPI: Patient is a 46 y.o. female here for bilateral knee pain.  10/11: Patient reports she's been doing a 90 day challenge and circuit training. She started to get pain anterior left knee and started to use a knee brace. Then noticed pain anterior right knee as well. Tried new shoes, better knee brace. Associated swelling and stiffness. Discomfort is worse with stairs. Taking a joint supplement. Pain level up to 3-4/10 at worst. No prior issues with her knees. Pain started about 2 months ago. No skin changes, numbness.  11/26: Patient reports she feels much better. Pain is currently 0/10. Able to spin but hasn't returned to workout classes. Gets some discomfort in right knee if squatting and standing up from squatting. Doing home exercises. Not taking any medicines for this. No skin changes, numbness.  Past Medical History:  Diagnosis Date  . Anemia   . Anxiety state 01/09/2014    Current Outpatient Medications on File Prior to Visit  Medication Sig Dispense Refill  . Misc Natural Products (JOINT SUPPORT COMPLEX) CAPS Take by mouth.     No current facility-administered medications on file prior to visit.     Past Surgical History:  Procedure Laterality Date  . TUBAL LIGATION    . TUBAL LIGATION      No Known Allergies  Social History   Socioeconomic History  . Marital status: Married    Spouse name: Not on file  . Number of children: Not on file  . Years of education: Not on file  . Highest education level: Not on file  Social Needs  . Financial resource strain: Not on file  . Food insecurity - worry: Not on file  . Food insecurity - inability: Not on file  . Transportation needs - medical: Not on file  . Transportation needs - non-medical: Not on file  Occupational History  . Not on file  Tobacco Use  . Smoking status: Never Smoker  . Smokeless tobacco: Never Used  Substance  and Sexual Activity  . Alcohol use: No  . Drug use: No  . Sexual activity: Yes  Other Topics Concern  . Not on file  Social History Narrative  . Not on file    Family History  Problem Relation Age of Onset  . Hypertension Mother   . Cancer Father     BP 111/78   Pulse 68   Ht 5\' 2"  (1.575 m)   Wt 167 lb (75.8 kg)   BMI 30.54 kg/m   Review of Systems: See HPI above.     Objective:  Physical Exam:  Gen: NAD, comfortable in exam room.  Right knee: No gross deformity, ecchymoses, swelling. No TTP. FROM. Negative ant/post drawers. Negative valgus/varus testing.  Negative mcmurrays, apleys.  Left knee: No gross deformity, ecchymoses, swelling. No TTP. FROM. Negative ant/post drawers. Negative valgus/varus testing.  Negative mcmurrays, apleys.   Assessment & Plan:  1. Bilateral knee pain - 2/2 arthritis.  Improving with home exercise program.  Advance activities (see instructions).  Tylenol, topical medications, supplements, aleve reviewed.  F/u prn.

## 2017-01-06 NOTE — Assessment & Plan Note (Signed)
2/2 arthritis.  Improving with home exercise program.  Advance activities (see instructions).  Tylenol, topical medications, supplements, aleve reviewed.  F/u prn.

## 2017-01-27 ENCOUNTER — Encounter: Payer: Self-pay | Admitting: Medical

## 2017-10-23 ENCOUNTER — Other Ambulatory Visit (HOSPITAL_COMMUNITY)
Admission: RE | Admit: 2017-10-23 | Discharge: 2017-10-23 | Disposition: A | Discharge status: Home / Self Care | Payer: Federal, State, Local not specified - PPO | Source: Ambulatory Visit | Attending: Obstetrics and Gynecology | Admitting: Obstetrics and Gynecology

## 2017-10-23 ENCOUNTER — Other Ambulatory Visit: Payer: Self-pay | Admitting: Obstetrics and Gynecology

## 2017-10-23 DIAGNOSIS — Z01411 Encounter for gynecological examination (general) (routine) with abnormal findings: Secondary | ICD-10-CM | POA: Diagnosis present

## 2017-10-23 DIAGNOSIS — Z1231 Encounter for screening mammogram for malignant neoplasm of breast: Secondary | ICD-10-CM

## 2017-10-27 LAB — CYTOLOGY - PAP
Diagnosis: NEGATIVE
HPV (WINDOPATH): NOT DETECTED

## 2017-11-19 ENCOUNTER — Ambulatory Visit
Admission: RE | Admit: 2017-11-19 | Discharge: 2017-11-19 | Disposition: A | Discharge status: Home / Self Care | Payer: Federal, State, Local not specified - PPO | Source: Ambulatory Visit | Attending: Obstetrics and Gynecology | Admitting: Obstetrics and Gynecology

## 2017-11-19 DIAGNOSIS — Z1231 Encounter for screening mammogram for malignant neoplasm of breast: Secondary | ICD-10-CM

## 2017-11-20 ENCOUNTER — Other Ambulatory Visit: Payer: Self-pay | Admitting: Obstetrics and Gynecology

## 2017-11-20 DIAGNOSIS — R928 Other abnormal and inconclusive findings on diagnostic imaging of breast: Secondary | ICD-10-CM

## 2017-11-26 ENCOUNTER — Ambulatory Visit
Admission: RE | Admit: 2017-11-26 | Discharge: 2017-11-26 | Disposition: A | Discharge status: Home / Self Care | Payer: Federal, State, Local not specified - PPO | Source: Ambulatory Visit | Attending: Obstetrics and Gynecology | Admitting: Obstetrics and Gynecology

## 2017-11-26 ENCOUNTER — Other Ambulatory Visit: Payer: Self-pay | Admitting: Obstetrics and Gynecology

## 2017-11-26 DIAGNOSIS — N63 Unspecified lump in unspecified breast: Secondary | ICD-10-CM

## 2017-11-26 DIAGNOSIS — R928 Other abnormal and inconclusive findings on diagnostic imaging of breast: Secondary | ICD-10-CM

## 2018-05-31 ENCOUNTER — Ambulatory Visit
Admission: RE | Admit: 2018-05-31 | Discharge: 2018-05-31 | Disposition: A | Discharge status: Home / Self Care | Payer: Federal, State, Local not specified - PPO | Source: Ambulatory Visit | Attending: Obstetrics and Gynecology | Admitting: Obstetrics and Gynecology

## 2018-05-31 ENCOUNTER — Other Ambulatory Visit: Payer: Self-pay

## 2018-05-31 DIAGNOSIS — N63 Unspecified lump in unspecified breast: Secondary | ICD-10-CM

## 2018-09-16 ENCOUNTER — Ambulatory Visit (HOSPITAL_COMMUNITY)
Admission: EM | Admit: 2018-09-16 | Discharge: 2018-09-16 | Disposition: A | Discharge status: Home / Self Care | Payer: Federal, State, Local not specified - PPO | Attending: Urgent Care | Admitting: Urgent Care

## 2018-09-16 ENCOUNTER — Encounter (HOSPITAL_COMMUNITY): Payer: Self-pay | Admitting: Emergency Medicine

## 2018-09-16 ENCOUNTER — Other Ambulatory Visit: Payer: Self-pay

## 2018-09-16 DIAGNOSIS — L299 Pruritus, unspecified: Secondary | ICD-10-CM

## 2018-09-16 DIAGNOSIS — L298 Other pruritus: Secondary | ICD-10-CM | POA: Diagnosis not present

## 2018-09-16 DIAGNOSIS — L500 Allergic urticaria: Secondary | ICD-10-CM | POA: Diagnosis not present

## 2018-09-16 DIAGNOSIS — T7840XA Allergy, unspecified, initial encounter: Secondary | ICD-10-CM

## 2018-09-16 DIAGNOSIS — T63421A Toxic effect of venom of ants, accidental (unintentional), initial encounter: Secondary | ICD-10-CM

## 2018-09-16 DIAGNOSIS — L509 Urticaria, unspecified: Secondary | ICD-10-CM

## 2018-09-16 MED ORDER — METHYLPREDNISOLONE SODIUM SUCC 125 MG IJ SOLR
INTRAMUSCULAR | Status: AC
  Filled 2018-09-16: qty 2

## 2018-09-16 MED ORDER — HYDROXYZINE HCL 25 MG PO TABS: 12.5000 mg | ORAL_TABLET | Freq: Three times a day (TID) | ORAL | 0 refills | Status: AC | PRN

## 2018-09-16 MED ORDER — METHYLPREDNISOLONE SODIUM SUCC 125 MG IJ SOLR
125.0000 mg | Freq: Once | INTRAMUSCULAR | Status: AC
  Administered 2018-09-16: 125 mg via INTRAMUSCULAR

## 2018-09-16 MED ORDER — EPINEPHRINE 0.15 MG/0.15ML IJ SOAJ: 0.1500 mg | INTRAMUSCULAR | 0 refills | Status: AC | PRN

## 2018-09-16 NOTE — ED Triage Notes (Signed)
PT was stung by a fire ant at 2pm. PT took 2 benadryl tablets at 2:45. PT had an anaphylactic reaction when she was 48 years old.  Denies difficulty breathing at this time.

## 2018-09-16 NOTE — ED Provider Notes (Signed)
MRN: [id number] DOB: 1970/03/05  Subjective:   Kristin Macdonald is a 48 y.o. female presenting for acute onset moderate-severe worsening allergic reaction following sting from a fire ant. This happened ~14:00 today. Has a hx of anaphylactic reaction to fire ant as a child. Does not have any epi pens currently. Needs a refill.   No current facility-administered medications for this encounter.   Current Outpatient Medications:  Marland Kitchen  Misc Natural Products (JOINT SUPPORT COMPLEX) CAPS, Take by mouth., Disp: , Rfl:    No Known Allergies  Past Medical History:  Diagnosis Date  . Anemia   . Anxiety state 01/09/2014     Past Surgical History:  Procedure Laterality Date  . TUBAL LIGATION    . TUBAL LIGATION      Review of Systems  Constitutional: Negative for fever and malaise/fatigue.  HENT: Negative for congestion, ear pain, sinus pain and sore throat.   Eyes: Negative for blurred vision, double vision, discharge and redness.  Respiratory: Negative for cough, hemoptysis, shortness of breath and wheezing.   Cardiovascular: Negative for chest pain.  Gastrointestinal: Negative for abdominal pain, diarrhea, nausea and vomiting.  Genitourinary: Negative for dysuria, flank pain and hematuria.  Musculoskeletal: Negative for myalgias.  Skin: Positive for itching and rash.  Neurological: Negative for dizziness, weakness and headaches.  Psychiatric/Behavioral: Negative for depression and substance abuse.    Objective:   Vitals: BP 123/76   Pulse 87   Temp 98.7 F (37.1 C) (Oral)   Resp 16   LMP 09/06/2018   SpO2 98%   Physical Exam Constitutional:      General: She is not in acute distress.    Appearance: Normal appearance. She is well-developed and normal weight. She is not ill-appearing, toxic-appearing or diaphoretic.  HENT:     Head: Normocephalic and atraumatic.     Right Ear: External ear normal.     Left Ear: External ear normal.     Nose: Nose normal.     Mouth/Throat:      Mouth: Mucous membranes are moist.     Pharynx: Oropharynx is clear.     Comments: Airway is patent, no oral or facial swelling. Eyes:     General: No scleral icterus.    Extraocular Movements: Extraocular movements intact.     Pupils: Pupils are equal, round, and reactive to light.  Cardiovascular:     Rate and Rhythm: Normal rate and regular rhythm.     Pulses: Normal pulses.     Heart sounds: Normal heart sounds. No murmur. No friction rub. No gallop.   Pulmonary:     Effort: Pulmonary effort is normal. No respiratory distress.     Breath sounds: Normal breath sounds. No stridor. No wheezing, rhonchi or rales.  Abdominal:     General: Bowel sounds are normal. There is no distension.     Palpations: Abdomen is soft. There is no mass.     Tenderness: There is no abdominal tenderness. There is no right CVA tenderness, left CVA tenderness, guarding or rebound.  Skin:    General: Skin is warm and dry.     Coloration: Skin is not pale.     Findings: Rash (diffuse urticarial lesions over arms bilaterally extending into her neck and portions of her torso) present.  Neurological:     General: No focal deficit present.     Mental Status: She is alert and oriented to person, place, and time.  Psychiatric:        Mood and Affect:  Mood normal.        Behavior: Behavior normal.        Thought Content: Thought content normal.        Judgment: Judgment normal.     Assessment and Plan :   1. Allergic reaction, initial encounter   2. Urticaria   3. Fire ant bite, accidental or unintentional, initial encounter   4. Itching     IM solumedrol in clinic today. Use Vistaril for antihistamine properties. Refilled her epi pens. Counseled patient on potential for adverse effects with medications prescribed/recommended today, strict ER and return-to-clinic precautions discussed, patient verbalized understanding.    Wallis BambergMani, Ladene Allocca, [place] JerseyPA-C 09/16/18 1614

## 2018-10-22 ENCOUNTER — Other Ambulatory Visit: Payer: Self-pay | Admitting: Obstetrics and Gynecology

## 2018-10-22 DIAGNOSIS — Z1231 Encounter for screening mammogram for malignant neoplasm of breast: Secondary | ICD-10-CM

## 2018-12-06 ENCOUNTER — Ambulatory Visit
Admission: RE | Admit: 2018-12-06 | Discharge: 2018-12-06 | Disposition: A | Discharge status: Home / Self Care | Payer: Federal, State, Local not specified - PPO | Source: Ambulatory Visit | Attending: Obstetrics and Gynecology | Admitting: Obstetrics and Gynecology

## 2018-12-06 ENCOUNTER — Other Ambulatory Visit: Payer: Self-pay

## 2018-12-06 DIAGNOSIS — Z1231 Encounter for screening mammogram for malignant neoplasm of breast: Secondary | ICD-10-CM

## 2018-12-07 ENCOUNTER — Other Ambulatory Visit: Payer: Self-pay | Admitting: Obstetrics and Gynecology

## 2018-12-07 DIAGNOSIS — R928 Other abnormal and inconclusive findings on diagnostic imaging of breast: Secondary | ICD-10-CM

## 2018-12-08 ENCOUNTER — Ambulatory Visit
Admission: RE | Admit: 2018-12-08 | Discharge: 2018-12-08 | Disposition: A | Discharge status: Home / Self Care | Payer: Federal, State, Local not specified - PPO | Source: Ambulatory Visit | Attending: Obstetrics and Gynecology | Admitting: Obstetrics and Gynecology

## 2018-12-08 ENCOUNTER — Other Ambulatory Visit: Payer: Self-pay

## 2018-12-08 DIAGNOSIS — R928 Other abnormal and inconclusive findings on diagnostic imaging of breast: Secondary | ICD-10-CM

## 2019-04-22 ENCOUNTER — Ambulatory Visit (HOSPITAL_COMMUNITY)
Admission: EM | Admit: 2019-04-22 | Discharge: 2019-04-22 | Disposition: A | Discharge status: Home / Self Care | Payer: Federal, State, Local not specified - PPO | Attending: Family Medicine | Admitting: Family Medicine

## 2019-04-22 ENCOUNTER — Other Ambulatory Visit: Payer: Self-pay

## 2019-04-22 ENCOUNTER — Encounter (HOSPITAL_COMMUNITY): Payer: Self-pay | Admitting: Emergency Medicine

## 2019-04-22 DIAGNOSIS — R21 Rash and other nonspecific skin eruption: Secondary | ICD-10-CM | POA: Diagnosis not present

## 2019-04-22 MED ORDER — VALACYCLOVIR HCL 1 G PO TABS: 1000.0000 mg | ORAL_TABLET | Freq: Two times a day (BID) | ORAL | 0 refills | Status: AC

## 2019-04-22 MED ORDER — TRIAMCINOLONE ACETONIDE 0.5 % EX OINT: 1.0000 "application " | TOPICAL_OINTMENT | Freq: Two times a day (BID) | CUTANEOUS | 0 refills | Status: AC

## 2019-04-22 NOTE — Discharge Instructions (Addendum)
If rash worsens or does not improve with prescribed therapy return for follow-up evaluation and may possibly need an oral course of prednisone in addition to treatment I prescribed you today.

## 2019-04-22 NOTE — ED Triage Notes (Signed)
Pt c/o Rash on her L side, states she had shingles in her 20's and it looks like that. Symptoms for a few days.

## 2019-04-22 NOTE — ED Provider Notes (Signed)
[place]    CSN: [id number] Arrival date & time: 04/22/19  1556      History   Chief Complaint Chief Complaint  Patient presents with  . Rash    HPI Kristin Macdonald is a 49 y.o. female.   HPI  Patient presents for evaluation of a rash on her left side which has been present for 3 days.  Patient has a history of shingles several years ago and feels that current rash is presenting in a similar fashion. Rash is itching, localized to lower left flank. Endorses that the rash has expanded in diameter over the course of 3 days. She has not attempted relief with any topical medications.  Past Medical History:  Diagnosis Date  . Anemia   . Anxiety state 01/09/2014    Patient Active Problem List   Diagnosis Date Noted  . Bilateral knee pain 11/22/2016  . Wellness examination 05/21/2015  . Anxiety state 01/09/2014  . Cellulitis 12/30/2013  . Visit for suture removal 12/30/2013    Past Surgical History:  Procedure Laterality Date  . TUBAL LIGATION    . TUBAL LIGATION      OB History   No obstetric history on file.      Home Medications    Prior to Admission medications   Medication Sig Start Date End Date Taking? Authorizing Provider  EPINEPHrine 0.15 MG/0.15ML IJ injection Inject 0.15 mLs (0.15 mg total) into the muscle as needed for anaphylaxis. 09/16/18   Jaynee Eagles, PA-C  hydrOXYzine (ATARAX/VISTARIL) 25 MG tablet Take 0.5-1 tablets (12.5-25 mg total) by mouth every 8 (eight) hours as needed for itching. 09/16/18   Jaynee Eagles, PA-C  Misc Natural Products (JOINT SUPPORT COMPLEX) CAPS Take by mouth.    [provider]    Family History Family History  Problem Relation Age of Onset  . Hypertension Mother   . Cancer Father     Social History Social History   Tobacco Use  . Smoking status: Never Smoker  . Smokeless tobacco: Never Used  Substance Use Topics  . Alcohol use: No  . Drug use: No     Allergies   Patient has no known  allergies.   Review of Systems Review of Systems Pertinent negatives listed in HPI Physical Exam Triage Vital Signs ED Triage Vitals  Enc Vitals Group     BP 04/22/19 1622 112/64     Pulse Rate 04/22/19 1622 76     Resp 04/22/19 1622 16     Temp 04/22/19 1622 98.7 F (37.1 C)     Temp src --      SpO2 04/22/19 1622 100 %     Weight --      Height --      Head Circumference --      Peak Flow --      Pain Score 04/22/19 1625 0     Pain Loc --      Pain Edu? --      Excl. in [place]? --    No data found.  Updated Vital Signs BP 112/64   Pulse 76   Temp 98.7 F (37.1 C)   Resp 16   SpO2 100%   Visual Acuity Right Eye Distance:   Left Eye Distance:   Bilateral Distance:    Right Eye Near:   Left Eye Near:    Bilateral Near:     Physical Exam Constitutional:      Appearance: Normal appearance.  HENT:  Head: Normocephalic.     Nose: Nose normal.  Eyes:     Extraocular Movements: Extraocular movements intact.     Pupils: Pupils are equal, round, and reactive to light.  Cardiovascular:     Rate and Rhythm: Normal rate and regular rhythm.  Pulmonary:     Effort: Pulmonary effort is normal.     Breath sounds: Normal breath sounds.  Musculoskeletal:     Cervical back: Normal range of motion.  Skin:    General: Skin is warm and dry.     Capillary Refill: Capillary refill takes less than 2 seconds.     Findings: Erythema and rash present. Rash is vesicular.       Neurological:     Mental Status: She is alert.      UC Treatments / Results  Labs (all labs ordered are listed, but only abnormal results are displayed) Labs Reviewed - No data to display  EKG   Radiology No results found.  Procedures Procedures (including critical care time)  Medications Ordered in UC Medications - No data to display  Initial Impression / Assessment and Plan / UC Course  I have reviewed the triage vital signs and the nursing notes.  Pertinent labs & imaging  results that were available during my care of the patient were reviewed by me and considered in my medical decision making (see chart for details).    1. Rash -Fine vesicular like lesion appearing in clustered pattern. Suspicious for possible shingles. Will cover with Valacyclovir and topical steriodal cream. Advised if rash expands, oral prednisone may be indicated, for now will trial topical steroids.  Final Clinical Impressions(s) / UC Diagnoses   Final diagnoses:  Rash/skin eruption     Discharge Instructions     If rash worsens or does not improve with prescribed therapy return for follow-up evaluation and may possibly need an oral course of prednisone in addition to treatment I prescribed you today.    ED Prescriptions    Medication Sig Dispense Auth. Provider   valACYclovir (VALTREX) 1000 MG tablet Take 1 tablet (1,000 mg total) by mouth 2 (two) times daily for 10 days. 20 tablet Bing Neighbors, FNP   triamcinolone ointment (KENALOG) 0.5 % Apply 1 application topically 2 (two) times daily. 60 g Bing Neighbors, FNP     PDMP not reviewed this encounter.   Bing Neighbors, FNP 04/24/19 1455

## 2019-11-08 ENCOUNTER — Other Ambulatory Visit: Payer: Self-pay | Admitting: Obstetrics and Gynecology

## 2019-11-08 DIAGNOSIS — Z1231 Encounter for screening mammogram for malignant neoplasm of breast: Secondary | ICD-10-CM

## 2019-12-12 ENCOUNTER — Ambulatory Visit
Admission: RE | Admit: 2019-12-12 | Discharge: 2019-12-12 | Disposition: A | Discharge status: Home / Self Care | Payer: Federal, State, Local not specified - PPO | Source: Ambulatory Visit | Attending: Obstetrics and Gynecology | Admitting: Obstetrics and Gynecology

## 2019-12-12 ENCOUNTER — Other Ambulatory Visit: Payer: Self-pay

## 2019-12-12 DIAGNOSIS — Z1231 Encounter for screening mammogram for malignant neoplasm of breast: Secondary | ICD-10-CM

## 2020-11-21 ENCOUNTER — Other Ambulatory Visit: Payer: Self-pay | Admitting: Obstetrics and Gynecology

## 2020-11-21 DIAGNOSIS — Z1231 Encounter for screening mammogram for malignant neoplasm of breast: Secondary | ICD-10-CM

## 2020-12-18 ENCOUNTER — Ambulatory Visit
Admission: RE | Admit: 2020-12-18 | Discharge: 2020-12-18 | Disposition: A | Discharge status: Home / Self Care | Payer: Federal, State, Local not specified - PPO | Source: Ambulatory Visit | Attending: Obstetrics and Gynecology | Admitting: Obstetrics and Gynecology

## 2020-12-18 ENCOUNTER — Other Ambulatory Visit: Payer: Self-pay

## 2020-12-18 DIAGNOSIS — Z1231 Encounter for screening mammogram for malignant neoplasm of breast: Secondary | ICD-10-CM

## 2022-01-10 ENCOUNTER — Other Ambulatory Visit: Payer: Self-pay | Admitting: Internal Medicine

## 2022-01-10 DIAGNOSIS — Z1231 Encounter for screening mammogram for malignant neoplasm of breast: Secondary | ICD-10-CM

## 2022-03-10 ENCOUNTER — Ambulatory Visit
Admission: RE | Admit: 2022-03-10 | Discharge: 2022-03-10 | Disposition: A | Discharge status: Home / Self Care | Payer: Federal, State, Local not specified - PPO | Source: Ambulatory Visit | Attending: Internal Medicine | Admitting: Internal Medicine

## 2022-03-10 DIAGNOSIS — Z1231 Encounter for screening mammogram for malignant neoplasm of breast: Secondary | ICD-10-CM

## 2023-03-27 ENCOUNTER — Other Ambulatory Visit: Payer: Self-pay | Admitting: Internal Medicine

## 2023-03-27 DIAGNOSIS — Z0000 Encounter for general adult medical examination without abnormal findings: Secondary | ICD-10-CM

## 2023-04-01 ENCOUNTER — Ambulatory Visit
Admission: RE | Admit: 2023-04-01 | Discharge: 2023-04-01 | Discharge status: Home / Self Care | Payer: Federal, State, Local not specified - PPO | Source: Ambulatory Visit | Attending: Internal Medicine | Admitting: Internal Medicine

## 2023-04-01 DIAGNOSIS — Z0000 Encounter for general adult medical examination without abnormal findings: Secondary | ICD-10-CM

## 2023-08-20 IMAGING — MG MM DIGITAL SCREENING BILAT W/ TOMO AND CAD
8 series · 8 of 24 positions shown · non-contrast
Comparison: Previous exam(s).

CLINICAL DATA: Screening.

EXAM:
DIGITAL SCREENING BILATERAL MAMMOGRAM WITH TOMOSYNTHESIS AND CAD
TECHNIQUE: Bilateral screening digital craniocaudal and mediolateral oblique
mammograms were obtained. Bilateral screening digital breast
tomosynthesis was performed. The images were evaluated with
computer-aided detection.

[R CC synth-2D]
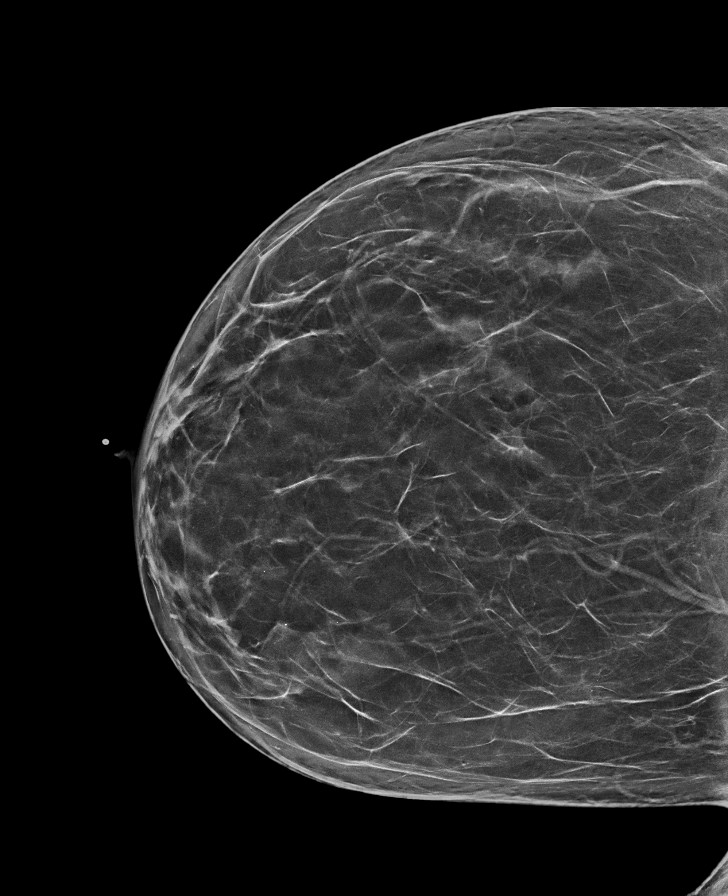

[L MLO synth-2D]
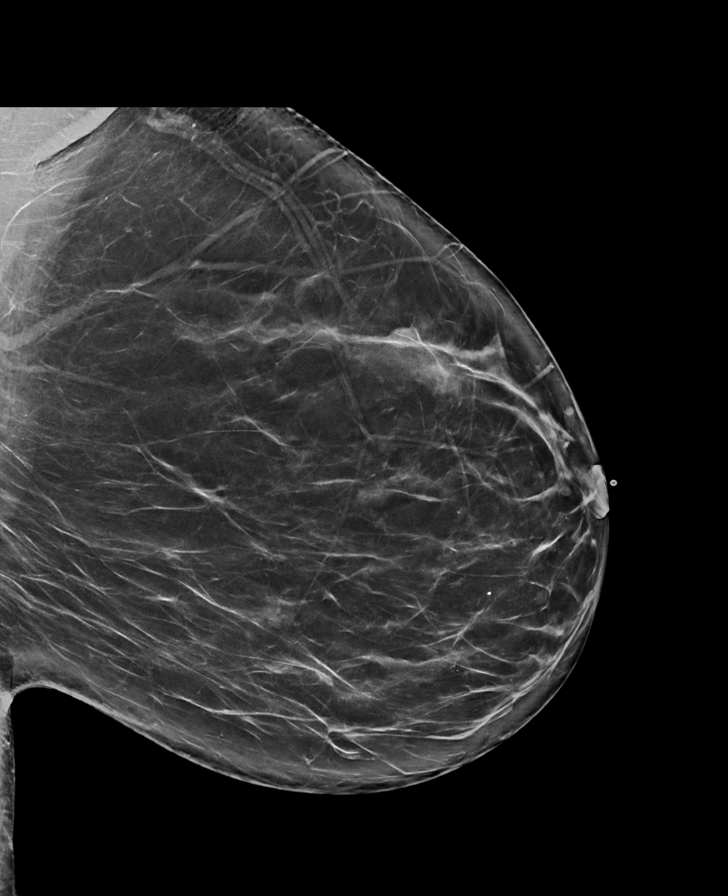

[L CC synth-2D]
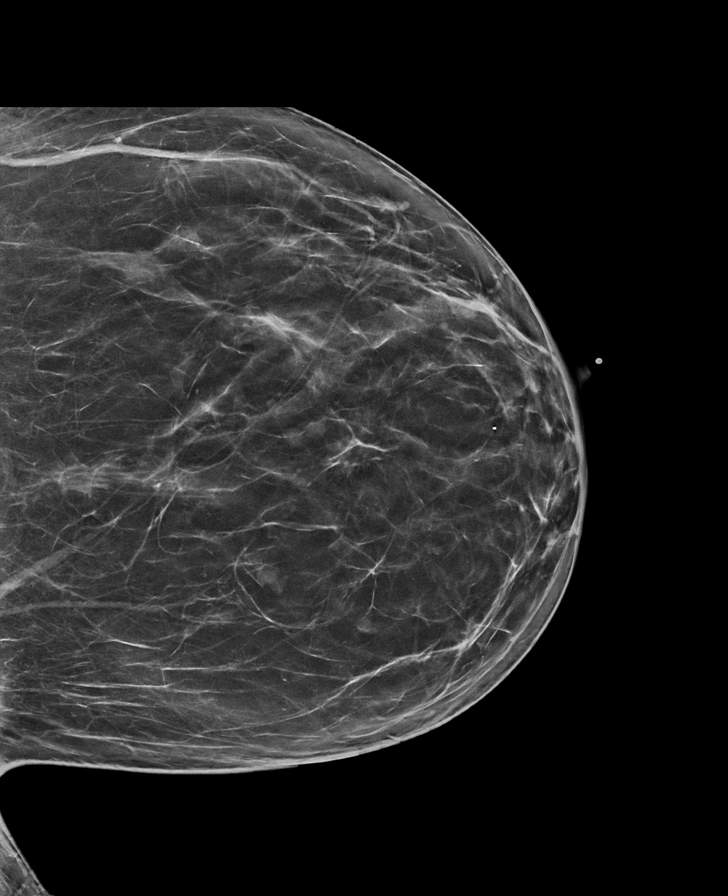

[R MLO synth-2D]
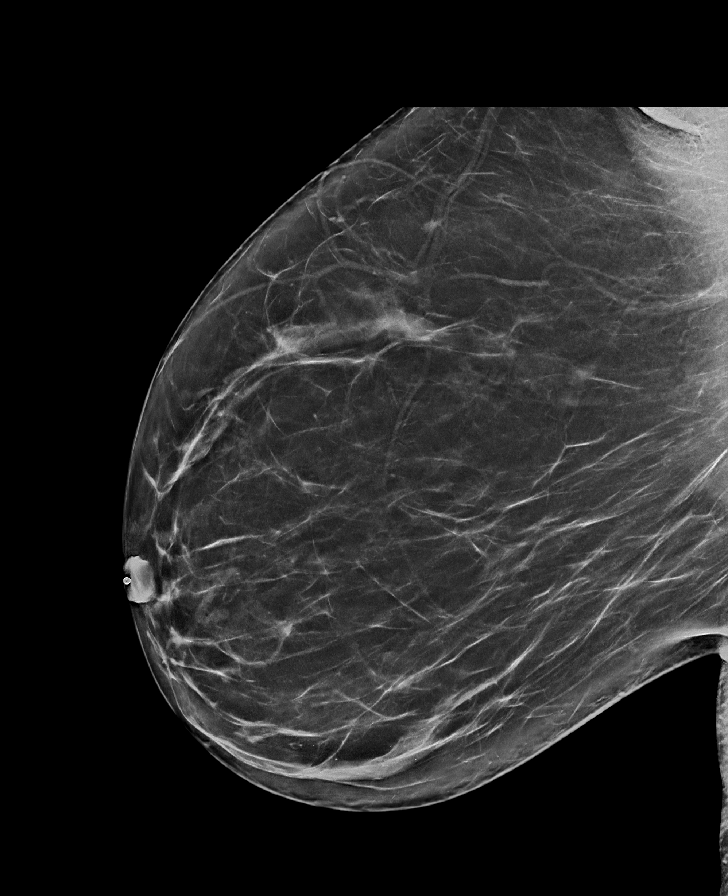

[R CC tomo · tomo slice 41/82.0]
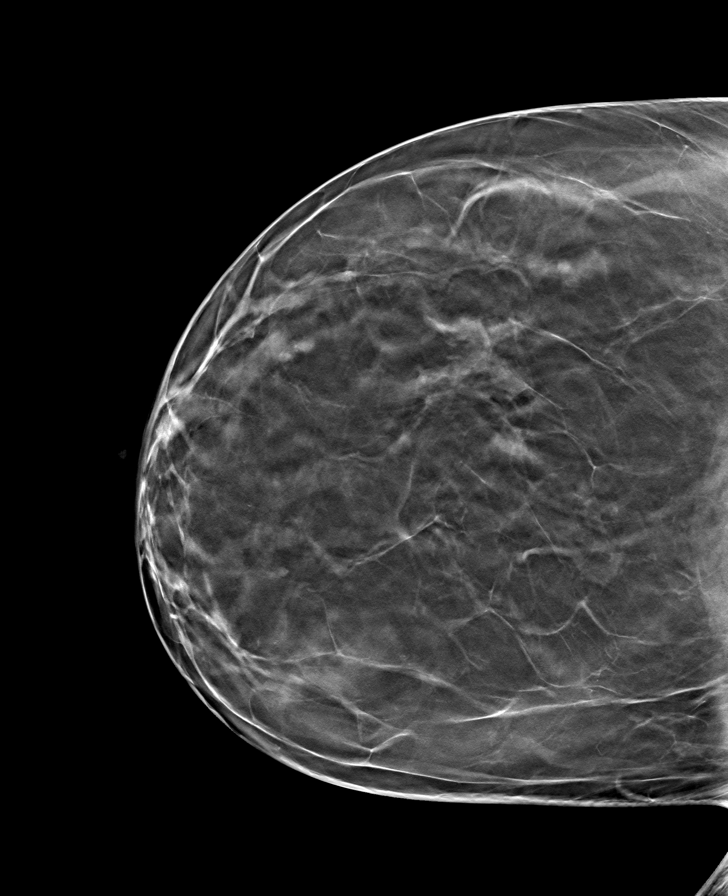

[R MLO tomo · tomo slice 46/91.0]
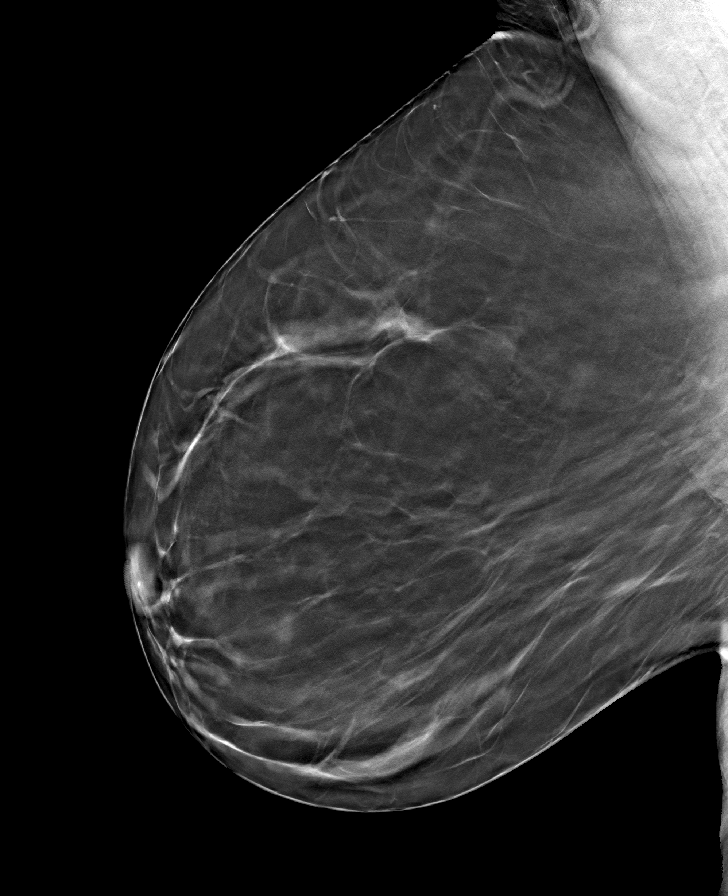

[L MLO tomo · tomo slice 47/94.0]
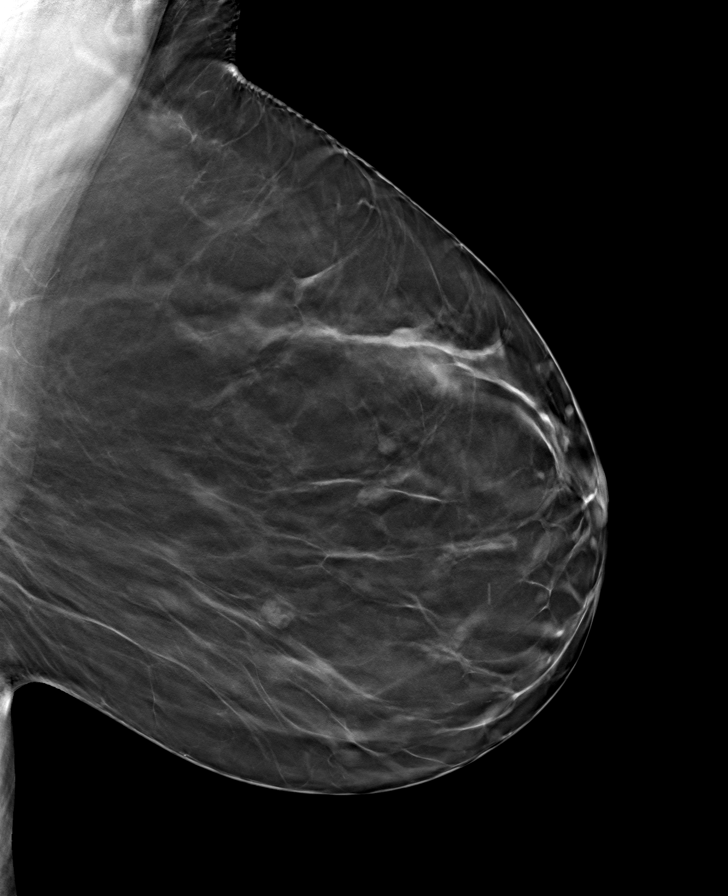

[L CC tomo · tomo slice 41/80.0]
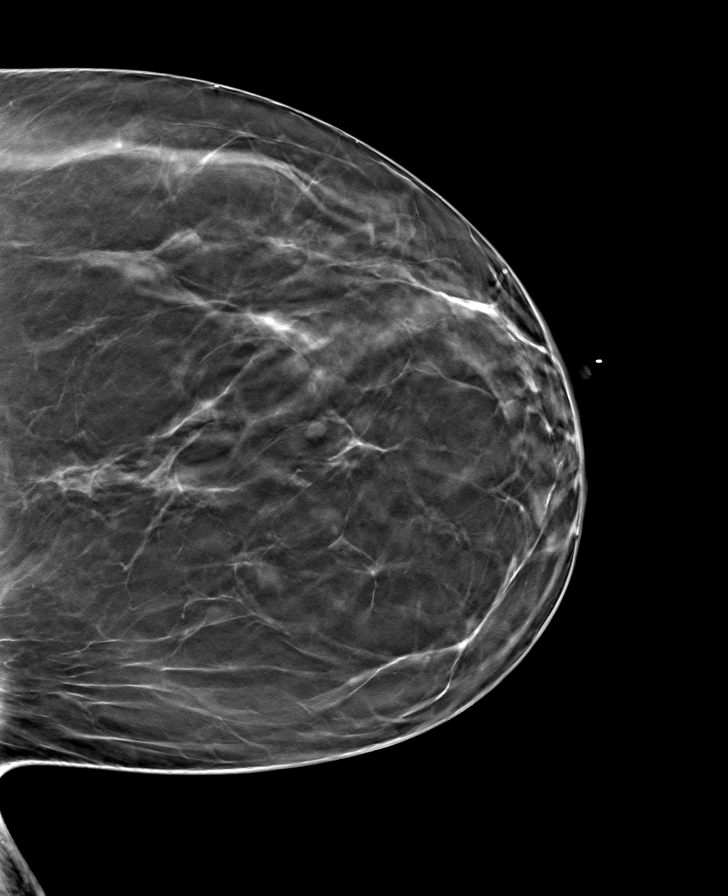

[8 of 24 positions shown; findings below may reference images not displayed]

ACR Breast Density Category b: There are scattered areas of
fibroglandular density.
FINDINGS: There are no findings suspicious for malignancy.
IMPRESSION: No mammographic evidence of malignancy. A result letter of this
screening mammogram will be mailed directly to the patient.

RECOMMENDATION:
Screening mammogram in one year. (Code:51-O-LD2)

BI-RADS CATEGORY  1: Negative.

## 2024-03-15 ENCOUNTER — Other Ambulatory Visit: Payer: Self-pay | Admitting: Internal Medicine

## 2024-03-15 DIAGNOSIS — Z1231 Encounter for screening mammogram for malignant neoplasm of breast: Secondary | ICD-10-CM

## 2024-04-01 ENCOUNTER — Ambulatory Visit
